# Patient Record
Sex: Male | Born: 1940
Health system: Southern US, Community
[De-identification: ages and names within clinical notes are randomized; demographics above are authoritative.]

## PROBLEM LIST (undated history)

## (undated) DIAGNOSIS — E785 Hyperlipidemia, unspecified: Secondary | ICD-10-CM

## (undated) DIAGNOSIS — K219 Gastro-esophageal reflux disease without esophagitis: Secondary | ICD-10-CM

## (undated) HISTORY — PX: HERNIA REPAIR: SHX51

## (undated) HISTORY — PX: COLON SURGERY: SHX602

## (undated) HISTORY — DX: Gastro-esophageal reflux disease without esophagitis: K21.9

## (undated) HISTORY — DX: Hyperlipidemia, unspecified: E78.5

---

## 1998-09-11 ENCOUNTER — Other Ambulatory Visit: Admission: RE | Admit: 1998-09-11 | Discharge: 1998-09-11 | Payer: Self-pay | Admitting: Gastroenterology

## 2002-06-05 ENCOUNTER — Encounter: Admission: RE | Admit: 2002-06-05 | Discharge: 2002-06-05 | Payer: Self-pay | Admitting: Family Medicine

## 2002-06-05 ENCOUNTER — Encounter: Payer: Self-pay | Admitting: Family Medicine

## 2003-06-27 ENCOUNTER — Ambulatory Visit (HOSPITAL_COMMUNITY): Admission: RE | Admit: 2003-06-27 | Discharge: 2003-06-27 | Payer: Self-pay | Admitting: Specialist

## 2008-06-26 ENCOUNTER — Encounter: Admission: RE | Admit: 2008-06-26 | Discharge: 2008-06-26 | Payer: Self-pay | Admitting: Gastroenterology

## 2008-07-26 ENCOUNTER — Encounter: Admission: RE | Admit: 2008-07-26 | Discharge: 2008-07-26 | Payer: Self-pay | Admitting: Surgery

## 2008-09-01 ENCOUNTER — Emergency Department (HOSPITAL_COMMUNITY): Admission: EM | Admit: 2008-09-01 | Discharge: 2008-09-01 | Payer: Self-pay | Admitting: Emergency Medicine

## 2008-09-18 ENCOUNTER — Encounter (INDEPENDENT_AMBULATORY_CARE_PROVIDER_SITE_OTHER): Payer: Self-pay | Admitting: *Deleted

## 2008-09-28 ENCOUNTER — Inpatient Hospital Stay (HOSPITAL_COMMUNITY): Admission: RE | Admit: 2008-09-28 | Discharge: 2008-09-29 | Payer: Self-pay | Admitting: Surgery

## 2008-12-19 ENCOUNTER — Inpatient Hospital Stay (HOSPITAL_COMMUNITY): Admission: RE | Admit: 2008-12-19 | Discharge: 2008-12-23 | Payer: Self-pay | Admitting: Surgery

## 2008-12-19 ENCOUNTER — Encounter (INDEPENDENT_AMBULATORY_CARE_PROVIDER_SITE_OTHER): Payer: Self-pay | Admitting: Surgery

## 2009-07-25 ENCOUNTER — Encounter: Admission: RE | Admit: 2009-07-25 | Discharge: 2009-07-25 | Payer: Self-pay | Admitting: Surgery

## 2009-08-26 ENCOUNTER — Telehealth: Payer: Self-pay | Admitting: Gastroenterology

## 2009-10-16 ENCOUNTER — Inpatient Hospital Stay (HOSPITAL_COMMUNITY): Admission: RE | Admit: 2009-10-16 | Discharge: 2009-10-19 | Payer: Self-pay | Admitting: Surgery

## 2010-04-21 ENCOUNTER — Ambulatory Visit (HOSPITAL_COMMUNITY): Admission: RE | Admit: 2010-04-21 | Discharge: 2010-04-21 | Payer: Self-pay | Admitting: Surgery

## 2010-08-12 NOTE — Progress Notes (Signed)
Summary: Changed GI  Phone Note Outgoing Call Call back at [facility] Phone [phone]   Call placed by: Harlow Mares CMA Duncan Dull),  August 26, 2009 11:58 AM Call placed to: Patient Summary of Call: patient changed to [facility] GI, needed Colonoscopy Initial call taken by: Harlow Mares CMA Duncan Dull),  August 26, 2009 11:58 AM

## 2010-10-01 LAB — DIFFERENTIAL
Basophils Absolute: 0 10*3/uL (ref 0.0–0.1)
Eosinophils Relative: 0 % (ref 0–5)
Lymphocytes Relative: 15 % (ref 12–46)
Lymphs Abs: 0.9 10*3/uL (ref 0.7–4.0)
Neutro Abs: 4.8 10*3/uL (ref 1.7–7.7)
Neutrophils Relative %: 75 % (ref 43–77)

## 2010-10-01 LAB — CBC
HCT: 36.7 % — ABNORMAL LOW (ref 39.0–52.0)
Platelets: 117 10*3/uL — ABNORMAL LOW (ref 150–400)
RDW: 15.4 % (ref 11.5–15.5)
WBC: 6.4 10*3/uL (ref 4.0–10.5)

## 2010-10-20 LAB — BASIC METABOLIC PANEL
BUN: 6 mg/dL (ref 6–23)
CO2: 30 mEq/L (ref 19–32)
Chloride: 107 mEq/L (ref 96–112)
Creatinine, Ser: 1 mg/dL (ref 0.4–1.5)
Creatinine, Ser: 1.09 mg/dL (ref 0.4–1.5)
GFR calc Af Amer: >60 mL/min (ref >60)
GFR calc Af Amer: >60 mL/min (ref >60)
GFR calc non Af Amer: >60 mL/min (ref >60)
Potassium: 4 mEq/L (ref 3.5–5.1)
Sodium: 142 mEq/L (ref 135–145)

## 2010-10-20 LAB — CBC
HCT: 39.9 % (ref 39.0–52.0)
Hemoglobin: 13.8 g/dL (ref 13.0–17.0)
MCV: 95.4 fL (ref 78.0–100.0)
Platelets: 144 10*3/uL — ABNORMAL LOW (ref 150–400)
RBC: 4.43 MIL/uL (ref 4.22–5.81)
WBC: 12.4 10*3/uL — ABNORMAL HIGH (ref 4.0–10.5)
WBC: 12.6 10*3/uL — ABNORMAL HIGH (ref 4.0–10.5)

## 2010-10-20 LAB — HEMOGLOBIN AND HEMATOCRIT, BLOOD
HCT: 44.2 % (ref 39.0–52.0)
Hemoglobin: 14.3 g/dL (ref 13.0–17.0)

## 2010-10-23 LAB — BASIC METABOLIC PANEL
BUN: 13 mg/dL (ref 6–23)
CO2: 27 mEq/L (ref 19–32)
Chloride: 106 mEq/L (ref 96–112)
Glucose, Bld: 104 mg/dL — ABNORMAL HIGH (ref 70–99)
Potassium: 3.8 mEq/L (ref 3.5–5.1)

## 2010-10-23 LAB — CBC
HCT: 37 % — ABNORMAL LOW (ref 39.0–52.0)
Hemoglobin: 12.1 g/dL — ABNORMAL LOW (ref 13.0–17.0)
Hemoglobin: 12.3 g/dL — ABNORMAL LOW (ref 13.0–17.0)
MCHC: 33.2 g/dL (ref 30.0–36.0)
MCHC: 33.5 g/dL (ref 30.0–36.0)
MCV: 97.7 fL (ref 78.0–100.0)
RBC: 3.78 MIL/uL — ABNORMAL LOW (ref 4.22–5.81)
RDW: 12.9 % (ref 11.5–15.5)

## 2010-10-23 LAB — DIFFERENTIAL
Basophils Relative: 0 % (ref 0–1)
Eosinophils Absolute: 0 10*3/uL (ref 0.0–0.7)
Eosinophils Relative: 0 % (ref 0–5)
Monocytes Absolute: 1.4 10*3/uL — ABNORMAL HIGH (ref 0.1–1.0)
Monocytes Relative: 13 % — ABNORMAL HIGH (ref 3–12)
Neutro Abs: 7.7 10*3/uL (ref 1.7–7.7)

## 2010-10-28 LAB — URINALYSIS, ROUTINE W REFLEX MICROSCOPIC
Protein, ur: 30 mg/dL — AB
Urobilinogen, UA: 1 mg/dL (ref 0.0–1.0)

## 2010-10-28 LAB — URINE MICROSCOPIC-ADD ON

## 2010-10-28 LAB — URINE CULTURE: Colony Count: >=100000

## 2010-11-25 NOTE — Op Note (Signed)
NAME:  Michael Brock, Michael Brock NO.:  [id number]   MEDICAL RECORD NO.:  [id number]          PATIENT TYPE:  OIB   LOCATION:  1526                         FACILITY:  [facility]   PHYSICIAN:  Thornton Park. Daphine Deutscher, MD  DATE OF BIRTH:  12-Feb-1941   DATE OF PROCEDURE:  09/26/2008  DATE OF DISCHARGE:                               OPERATIVE REPORT   PREOPERATIVE DIAGNOSIS:  Large complex type 3 mixed hiatal hernia with  some organoaxial volvulus.   POSTOPERATIVE DIAGNOSIS:  Large complex type 3 mixed hiatal hernia with  some organoaxial volvulus.   PROCEDURE:  Laparoscopic takedown of hiatal hernia with removal of sac,  closure of diaphragm with 4 sutures posteriorly and 1 anteriorly - all  with pledgets, and gastropexy x2 and upper endoscopy.   SURGEON:  Luretha Murphy, M.D.   ASSISTANT:  Baruch Merl, M.D.   ANESTHESIA:  General endotracheal.   DESCRIPTION OF PROCEDURE:  Michael Brock is a 70 year old man with a large  type 3 mixed hiatal hernia.  Also has an adenomatous polyp of his colon,  and we would need to address it at a separate time.  Today, he was taken  to room 1 and given general anesthesia.  The abdomen was prepped with a  Techni-Care equivalent and draped sterilely.  Access to the abdomen was  gained through the left upper quadrant using 0-degree OptiView.  Once  the abdomen was entered, it was insufflated.  Standard trocars included  a 5 in the upper midline for the [facility] and the rest were 12's, two  on the right, one slightly to the left the umbilicus and a second  laterally on the left.  The stomach was pretty much all into the chest.  I went ahead and began reducing that, and at some point, I just began  taking down the sac along the right crus and then teasing it away, going  up into the mediastinum and then doing the same anteriorly, carrying it  anteriorly and over on the left side.  I then identified the left crus,  and I cut the sac away from the left  crus and completed the sac  mobilization there.  I then went down below and went underneath and put  a Penrose drain around the stomach at that point.   With that in place, with traction, I then dissected the foregut taking  away the adhesions of the stomach and sac from the mediastinum.  Dr.  Colin Benton passed the endoscope, and we checked our anatomy with that to make  certain we were where we thought we were and also to look for any  evidence of any occult injury.  We did not feel any was there, and we  did not see any evidence of that with endoscopy.   I felt like to try to do a wrap was not a prudent thing at this point  because of the distortion at the GE junction and the amount of bulky  tissue that had come to be around that, and I did not want to risk  perforation and elected to go  ahead and close the hiatus which I did  with 4 sutures posteriorly with pledgets, alternating tie knots and free  ties from the outside and one anteriorly.  We passed a 50 lighted  bougie, and this completely occupied the closed area.  I then took the  distal fundus and tacked it through the anterior abdominal wall.  We had  to get below the ribs and did so in 2 places.  The superior most when I  actually tied a knot in the stomach and then brought the 2 limbs out and  tied that down.  The second was through into the stomach, and we tied it  straight up to the anterior abdominal wall.  Once the pexy was  completed, the abdomen was deflated.  Liver was placed back in its  anatomic position with the Richton out.  There was no evidence of any  other problems.  I looked at the colon going in and did not see  anything obviously, and the liver looked clean, so we went ahead and  deflated.  I injected port sites with half percent Marcaine, and I  closed the individual trocar sites with 4-0 Vicryl, Benzoin and Steri-  Strips.  The patient seemed to tolerate the procedure well and was taken  to the recovery room  in satisfactory addition.      Thornton Park Daphine Deutscher, MD  Electronically Signed     MBM/MEDQ  D:  09/26/2008  T:  09/26/2008  Job:  [id number]   cc:   Donia Guiles, M.D.  Fax: [phone]   Tasia Catchings, M.D.  Fax: [phone]

## 2010-11-25 NOTE — Op Note (Signed)
NAME:  Michael Brock, Michael Brock NO.:  [id number]   MEDICAL RECORD NO.:  [id number]          PATIENT TYPE:  INP   LOCATION:  1529                         FACILITY:  [facility]   PHYSICIAN:  Thornton Park. Daphine Deutscher, MD  DATE OF BIRTH:  Aug 07, 1940   DATE OF PROCEDURE:  12/19/2008  DATE OF DISCHARGE:                               OPERATIVE REPORT   PREOPERATIVE DIAGNOSIS:  Adenomatous polyp in the proximal transverse  colon unresectable with colonoscopy, tattooed on June 8.   PROCEDURE:  Laparoscopically assisted right hemicolectomy and repair of  umbilical hernia.   SURGEON:  Valarie Merino, MD   ASSISTANT:  Iona Coach, MD   SPECIMEN:  Right colon with tattooed area in the proximal transverse  colon.   DESCRIPTION OF PROCEDURE:  This 70 year old man was taken to the OR 1 on  Wednesday, December 19, 2008, and given general anesthesia.  The abdomen was  prepped with Techni-Care equivalent and draped sterilely.  Entered the  abdomen through an old trocar site in the left upper quadrant using an  Optiview technique and 5 mm trocar and 0 degree scope.  The abdomen was  entered and insufflated and then two other 5 were used one toward the  midline right at the umbilicus where I would subsequently repair his  umbilical hernia and another one in the left lower quadrant.  Through  these I was able to then used a Harmonic scalpel and I mobilized his  cecum, ascending colon and the proximal transverse colon.  When  mobilized I then was able to then go up and make a small transverse  incision incorporating the hernia and then peeling away his  properitoneal hernia and hernia sacs and removing those converting this  to one incision.  I then put in the wound protector by Excel.  I then  brought the colon out, marked it and found the area with the tattoo  which was readily visible.  I divided it in the terminal ileum and  distal to the tattooed area.  I performed a functional end-to-end  using  the stapler, aligning the bowel side-to-side.  First I closed the  mesenteric defect with interrupted silks and then performed the  anastomosis using the GIA along the tinea and along the antimesenteric  border of the small bowel.  This common defect was closed in two layers  with running 4-0 PDS and with 3-0 silk Lemberts.  We changed our gloves.  We also looked at the specimen to ensure that we had gotten it.  The  abdomen was irrigated.  No bleeding was seen.  I had taken down some of  the omentum off of that portion of the colon and that was not bleeding  and that was allowed to sort of return to the area of the colectomy.  The wound was then closed with zero Prolene popoffs medial to the  rectus.  The posterior rectus sheath was closed  with a running #1 PDS.  The anterior sheath was closed with interrupted  zero Prolene as well.  The wound was irrigated and the  skin was closed  with 4-0 Vicryl subcutaneously and with staples as were the puncture  holes for the trocars.  The patient tolerated the procedure well and was  taken to the recovery room in satisfactory condition.      Thornton Park Daphine Deutscher, MD  Electronically Signed     MBM/MEDQ  D:  12/19/2008  T:  12/19/2008  Job:  [id number]   cc:   Donia Guiles, M.D.  Fax: [phone]   Tasia Catchings, M.D.  Fax: [phone]

## 2010-11-28 NOTE — Discharge Summary (Signed)
NAME:  TAZ, VANNESS NO.:  [id number]   MEDICAL RECORD NO.:  [id number]          PATIENT TYPE:  INP   LOCATION:  1526                         FACILITY:  [facility]   PHYSICIAN:  Thornton Park. Daphine Deutscher, MD  DATE OF BIRTH:  1941-06-30   DATE OF ADMISSION:  09/26/2008  DATE OF DISCHARGE:  09/29/2008                               DISCHARGE SUMMARY   ADMITTING DIAGNOSES:  1. Giant type 3 mixed hiatal hernia taken care of during this      admission with laparoscopy takedown hiatal hernia, closure      diaphragm, gastropexy x2.  2. Adenomatous polyp of the transverse colon to be taken down within      the next 3 months with a laparoscopically-assisted colectomy.   COURSE IN THE HOSPITAL:  Mr. Baby is a 59 year old man who came in the  hospital for the above-mentioned operation.  He had his hiatal hernia  repaired with pledgeted closure posteriorly and a gastropexy.  Water  contrast study on March 18 showed no evidence of a leak.  Labs were  checked and looked fine.  He did well and was ready for discharge on  postop day #3.  Condition good.  Return to his preop medications.      Thornton Park Daphine Deutscher, MD  Electronically Signed     MBM/MEDQ  D:  10/22/2008  T:  10/22/2008  Job:  [id number]

## 2010-11-28 NOTE — Discharge Summary (Signed)
NAME:  Michael Brock, Michael Brock NO.:  [id number]   MEDICAL RECORD NO.:  [id number]          PATIENT TYPE:  INP   LOCATION:  1529                         FACILITY:  [facility]   PHYSICIAN:  Thornton Park. Daphine Deutscher, MD  DATE OF BIRTH:  [birth year]   DATE OF ADMISSION:  12/19/2008  DATE OF DISCHARGE:  12/23/2008                               DISCHARGE SUMMARY   CHIEF COMPLAINT:  Adenomatous polyps of the transverse colon, status  post tattooing procedure December 19, 2008, lap assisted right hemicolectomy.  Path diagnosis:  Tubulovillous adenoma.  No evidence of cancer.   DESCRIPTION OF PROCEDURE:  Michael Brock a 70 year old white male who was  admitted and underwent a right hemicolectomy lap, assisted.  He did very  well postoperatively.  I was able to look at the place where I had  previously done his hiatal hernia about six weeks before and it looked  good too.  He did well postoperatively.  He was discharged home to  follow-up in the office in 1-2 weeks.      Thornton Park Daphine Deutscher, MD  Electronically Signed     MBM/MEDQ  D:  01/23/2009  T:  01/24/2009  Job:  [id number]

## 2011-06-07 IMAGING — RF DG UGI W/ HIGH DENSITY W/KUB
16 of 23 series · 16 of 23 positions shown · non-contrast
Comparison: Preoperative study 07/26/2008.

CLINICAL DATA: 68-year-old male status post gastropexy surgery for
gastric volvulus/intrathoracic stomach in 8373.  Recent onset of
intermittent symptoms of esophageal obstruction, difficulty
swallowing.

UPPER GI SERIES W/HIGH DENSITY W/KUB
TECHNIQUE: After obtaining a scout radiograph, upper GI series
performed with barium and effervescent agent.
Fluoroscopy Time: 1.6 minutes

[Series 1: run · 1 of 1 slices shown (1 of 14)]
[im 1/1]
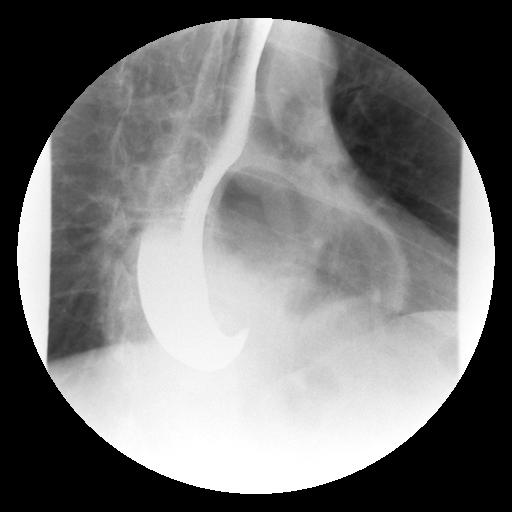

[Series 3: run · 1 of 1 slices shown (2 of 14)]
[im 1/1]
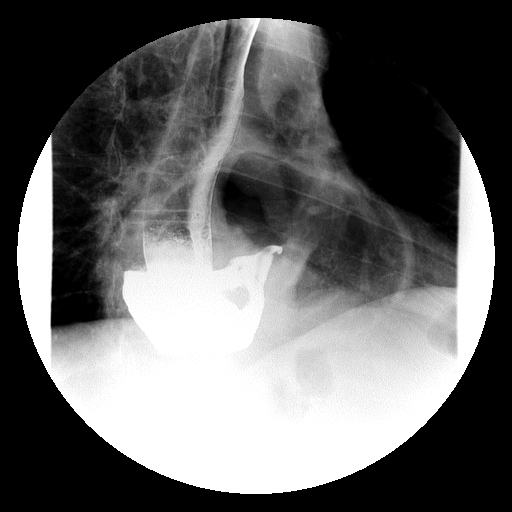

[Series 4: run · 1 of 1 slices shown (3 of 14)]
[im 1/1]
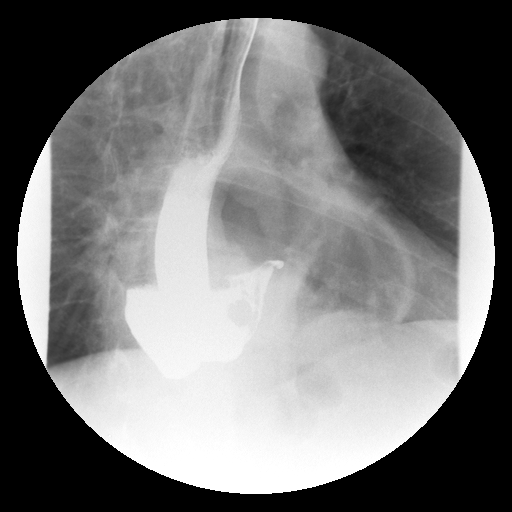

[Series 6: run · 1 of 1 slices shown (4 of 14)]
[im 1/1]
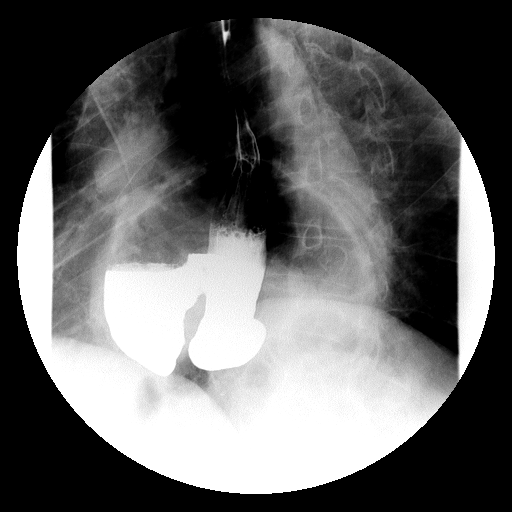

[Series 7: run · 1 of 1 slices shown (5 of 14)]
[im 1/1]
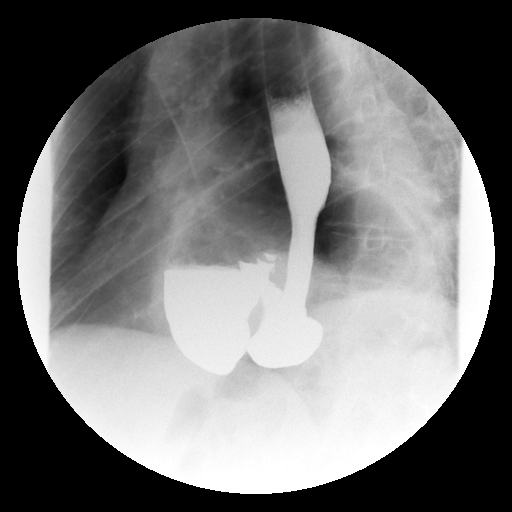

[Series 8: run · 1 of 1 slices shown (6 of 14)]
[im 1/1]
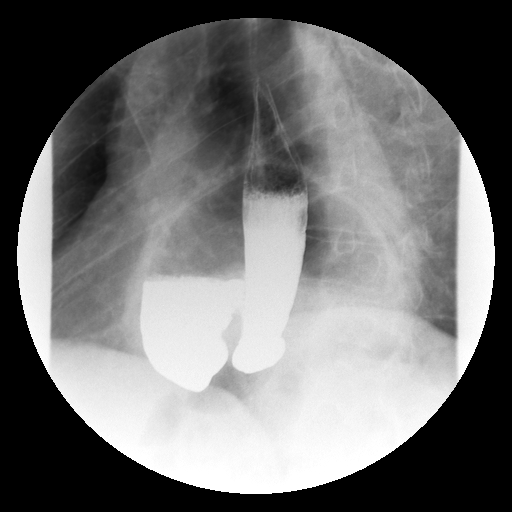

[Series 10: run · 1 of 1 slices shown (7 of 14)]
[im 1/1]
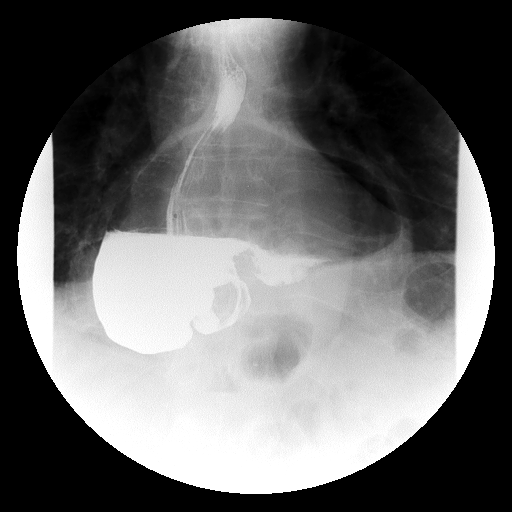

[Series 11: run · 1 of 1 slices shown (8 of 14)]
[im 1/1]
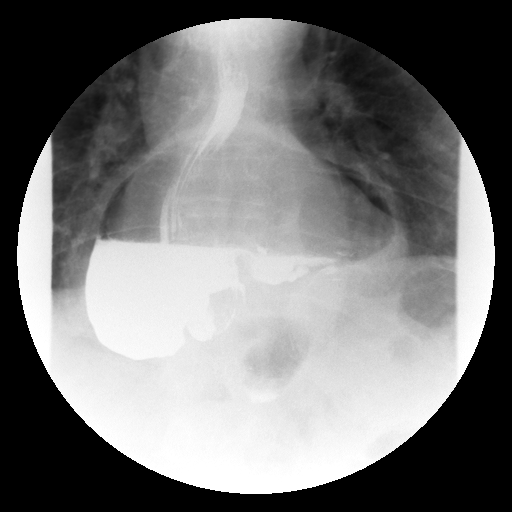

[Series 13: run · 1 of 1 slices shown (9 of 14)]
[im 1/1]
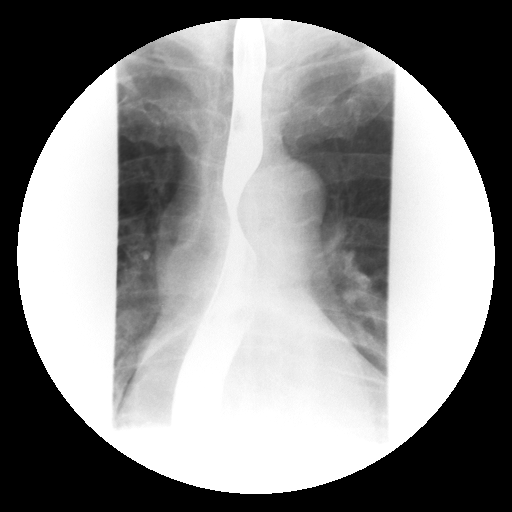

[Series 14: run · 1 of 1 slices shown (10 of 14)]
[im 1/1]
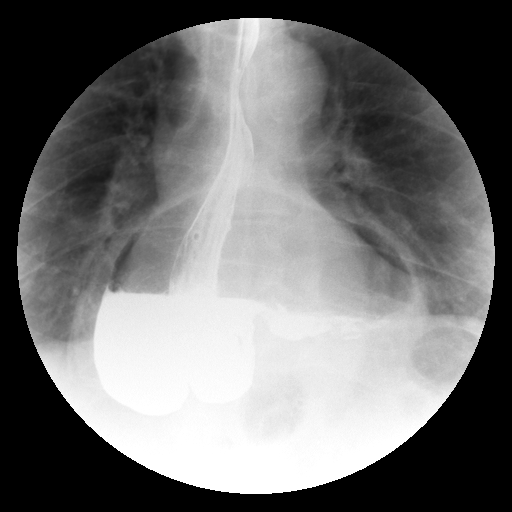

[Series 16: run · 1 of 1 slices shown (11 of 14)]
[im 1/1]
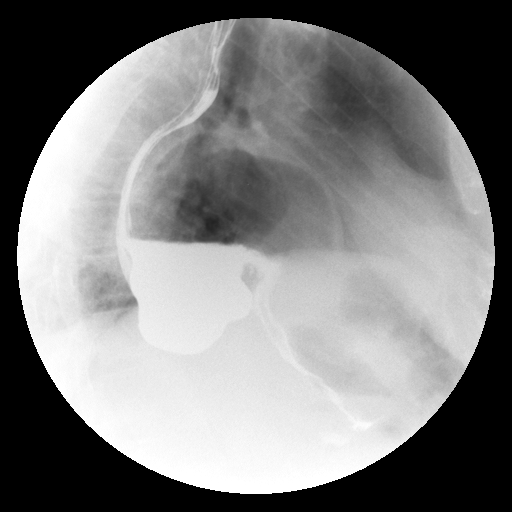

[Series 17: run · 1 of 1 slices shown (12 of 14)]
[im 1/1]
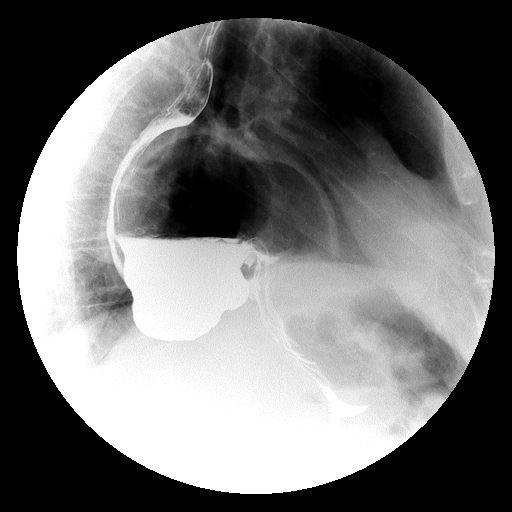

[Series 18: run · 1 of 1 slices shown (13 of 14)]
[im 1/1]
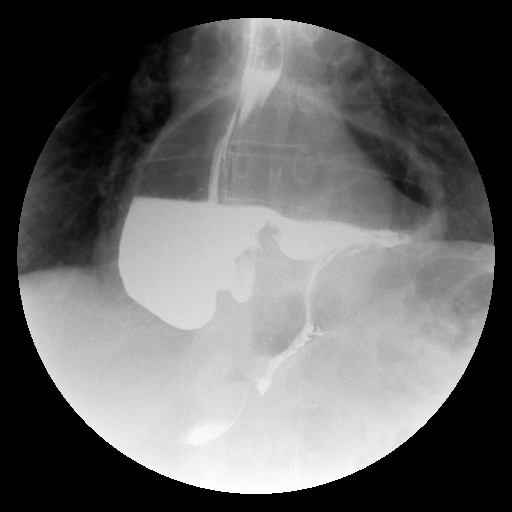

[Series 20: run · 1 of 1 slices shown (14 of 14)]
[im 1/1]
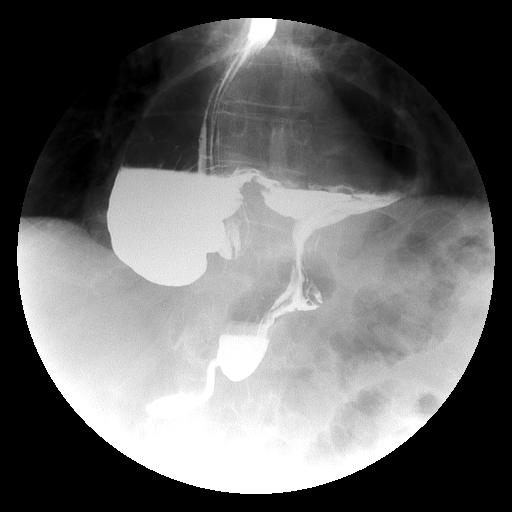

[Series 1001: view not recorded · 0.20mm/px · 1 of 1 slices shown (1 of 2)]
[im 1/1]
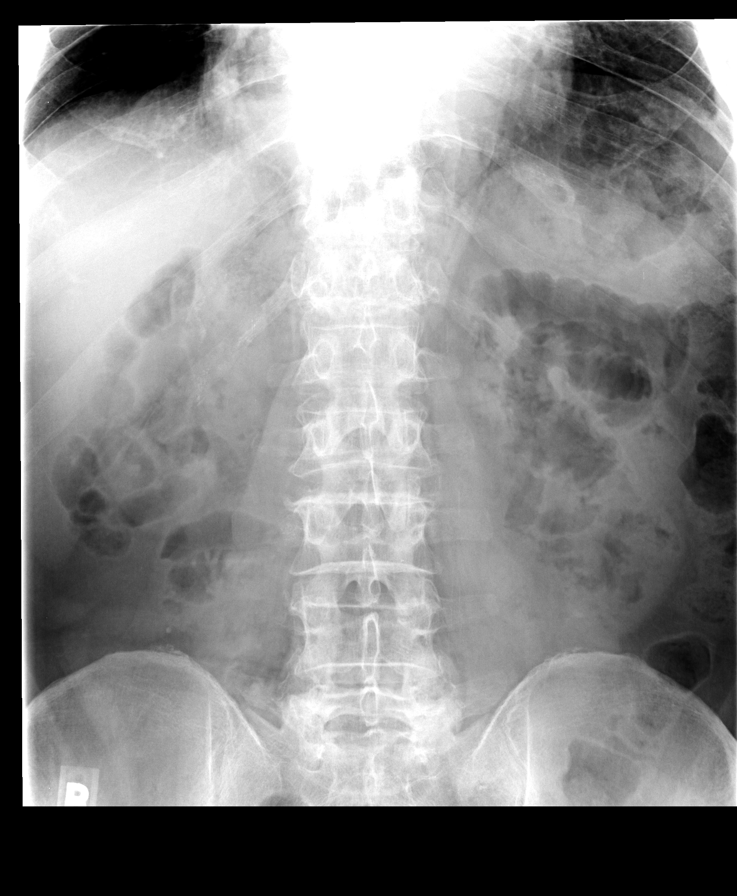

[Series 1003: view not recorded · 0.20mm/px · 1 of 1 slices shown (2 of 2)]
[im 1/1]
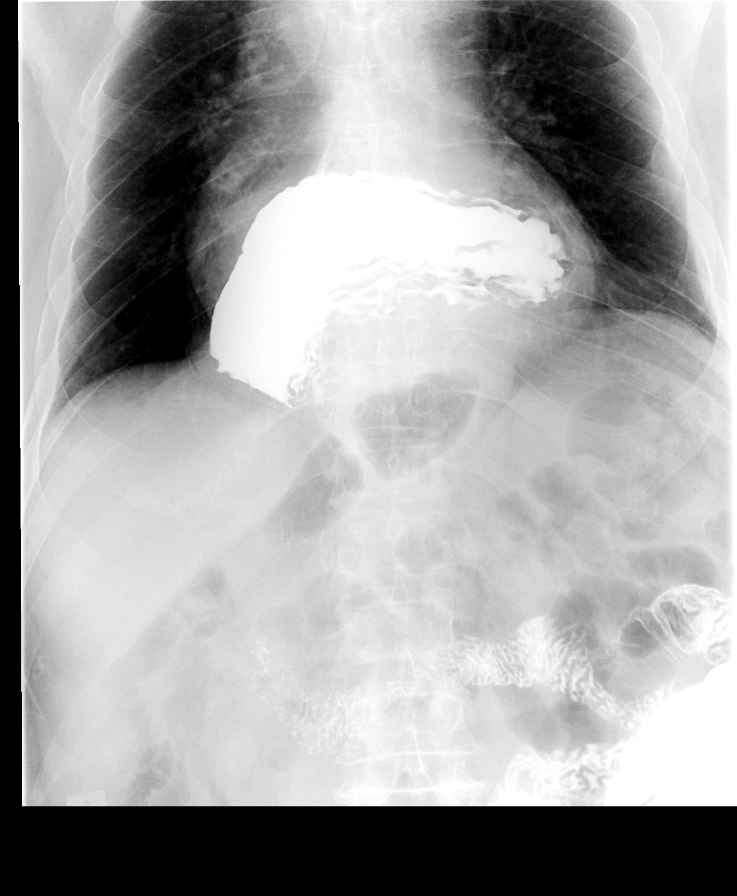

[16 of 23 positions shown; findings below may reference images not displayed]

FINDINGS: Preprocedural scout view of the abdomen demonstrates a
nonobstructed bowel gas pattern.  Surgical clips are seen at the
level of the diaphragm in the midline.  There is increased
retrocardiac density.

The patient tolerated the study well without difficulty.  The
thoracic esophagus is mildly distended, and air contrast level is
maintained in the distal esophagus.

There is fairly prompt transit of contrast through the
gastroesophageal junction which is located above the diaphragm, and
just to the right of midline.  The gastroesophageal junction, and
the body of the stomach are inverted.  The gastric cardia is
located on the right and posteriorly while the distal body on the
left and anteriorly.  The stomach is located anterior to the distal
thoracic esophagus, and there is mass effect due to the gastric
distention.  More proximal thoracic esophagus is normal.  The
distal gastric body and antrum then coursed through the diaphragm
anterior and to the left of the proximal stomach (image 17). After
short delay, gastric emptying is noted.  Duodenal sweep and
visualized proximal small bowel loops are normal.
IMPRESSION: 1.  Recurrence of gastric hernia/intrathoracic stomach with a
somewhat different configuration from the previous study, detailed
above (see radiograph #[DATE].  Mass effect on the distal thoracic esophagus.  No gastric
obstruction.

## 2011-07-30 DIAGNOSIS — L821 Other seborrheic keratosis: Secondary | ICD-10-CM | POA: Diagnosis not present

## 2011-07-30 DIAGNOSIS — Z85828 Personal history of other malignant neoplasm of skin: Secondary | ICD-10-CM | POA: Diagnosis not present

## 2011-09-04 DIAGNOSIS — J069 Acute upper respiratory infection, unspecified: Secondary | ICD-10-CM | POA: Diagnosis not present

## 2011-09-14 DIAGNOSIS — E782 Mixed hyperlipidemia: Secondary | ICD-10-CM | POA: Diagnosis not present

## 2011-09-24 DIAGNOSIS — H2510 Age-related nuclear cataract, unspecified eye: Secondary | ICD-10-CM | POA: Diagnosis not present

## 2011-11-19 DIAGNOSIS — K219 Gastro-esophageal reflux disease without esophagitis: Secondary | ICD-10-CM | POA: Diagnosis not present

## 2011-11-19 DIAGNOSIS — K2270 Barrett's esophagus without dysplasia: Secondary | ICD-10-CM | POA: Diagnosis not present

## 2011-11-19 DIAGNOSIS — Z86010 Personal history of colonic polyps: Secondary | ICD-10-CM | POA: Diagnosis not present

## 2012-01-04 ENCOUNTER — Other Ambulatory Visit: Payer: Self-pay | Admitting: Gastroenterology

## 2012-01-04 DIAGNOSIS — Z09 Encounter for follow-up examination after completed treatment for conditions other than malignant neoplasm: Secondary | ICD-10-CM | POA: Diagnosis not present

## 2012-01-04 DIAGNOSIS — K5730 Diverticulosis of large intestine without perforation or abscess without bleeding: Secondary | ICD-10-CM | POA: Diagnosis not present

## 2012-01-04 DIAGNOSIS — Z86010 Personal history of colonic polyps: Secondary | ICD-10-CM | POA: Diagnosis not present

## 2012-01-04 DIAGNOSIS — D126 Benign neoplasm of colon, unspecified: Secondary | ICD-10-CM | POA: Diagnosis not present

## 2012-04-27 DIAGNOSIS — Z23 Encounter for immunization: Secondary | ICD-10-CM | POA: Diagnosis not present

## 2012-05-05 DIAGNOSIS — E782 Mixed hyperlipidemia: Secondary | ICD-10-CM | POA: Diagnosis not present

## 2012-05-05 DIAGNOSIS — K219 Gastro-esophageal reflux disease without esophagitis: Secondary | ICD-10-CM | POA: Diagnosis not present

## 2012-05-05 DIAGNOSIS — K2270 Barrett's esophagus without dysplasia: Secondary | ICD-10-CM | POA: Diagnosis not present

## 2012-05-05 DIAGNOSIS — E669 Obesity, unspecified: Secondary | ICD-10-CM | POA: Diagnosis not present

## 2012-05-19 DIAGNOSIS — Z86010 Personal history of colonic polyps: Secondary | ICD-10-CM | POA: Diagnosis not present

## 2012-05-19 DIAGNOSIS — K2270 Barrett's esophagus without dysplasia: Secondary | ICD-10-CM | POA: Diagnosis not present

## 2012-05-19 DIAGNOSIS — K219 Gastro-esophageal reflux disease without esophagitis: Secondary | ICD-10-CM | POA: Diagnosis not present

## 2012-05-19 DIAGNOSIS — R141 Gas pain: Secondary | ICD-10-CM | POA: Diagnosis not present

## 2012-05-24 DIAGNOSIS — R12 Heartburn: Secondary | ICD-10-CM | POA: Diagnosis not present

## 2012-05-24 DIAGNOSIS — K2970 Gastritis, unspecified, without bleeding: Secondary | ICD-10-CM | POA: Diagnosis not present

## 2012-05-24 DIAGNOSIS — K2990 Gastroduodenitis, unspecified, without bleeding: Secondary | ICD-10-CM | POA: Diagnosis not present

## 2012-05-24 DIAGNOSIS — K2980 Duodenitis without bleeding: Secondary | ICD-10-CM | POA: Diagnosis not present

## 2012-05-24 DIAGNOSIS — K449 Diaphragmatic hernia without obstruction or gangrene: Secondary | ICD-10-CM | POA: Diagnosis not present

## 2012-05-24 DIAGNOSIS — K2270 Barrett's esophagus without dysplasia: Secondary | ICD-10-CM | POA: Diagnosis not present

## 2012-07-01 DIAGNOSIS — K219 Gastro-esophageal reflux disease without esophagitis: Secondary | ICD-10-CM | POA: Diagnosis not present

## 2012-07-07 ENCOUNTER — Encounter (INDEPENDENT_AMBULATORY_CARE_PROVIDER_SITE_OTHER): Payer: Self-pay | Admitting: Surgery

## 2012-07-28 DIAGNOSIS — L219 Seborrheic dermatitis, unspecified: Secondary | ICD-10-CM | POA: Diagnosis not present

## 2012-07-28 DIAGNOSIS — L570 Actinic keratosis: Secondary | ICD-10-CM | POA: Diagnosis not present

## 2012-07-28 DIAGNOSIS — D239 Other benign neoplasm of skin, unspecified: Secondary | ICD-10-CM | POA: Diagnosis not present

## 2012-07-28 DIAGNOSIS — Z85828 Personal history of other malignant neoplasm of skin: Secondary | ICD-10-CM | POA: Diagnosis not present

## 2012-07-28 DIAGNOSIS — B351 Tinea unguium: Secondary | ICD-10-CM | POA: Diagnosis not present

## 2012-07-28 DIAGNOSIS — D1801 Hemangioma of skin and subcutaneous tissue: Secondary | ICD-10-CM | POA: Diagnosis not present

## 2012-07-28 DIAGNOSIS — L909 Atrophic disorder of skin, unspecified: Secondary | ICD-10-CM | POA: Diagnosis not present

## 2012-07-28 DIAGNOSIS — L821 Other seborrheic keratosis: Secondary | ICD-10-CM | POA: Diagnosis not present

## 2012-10-24 DIAGNOSIS — H2510 Age-related nuclear cataract, unspecified eye: Secondary | ICD-10-CM | POA: Diagnosis not present

## 2012-11-01 DIAGNOSIS — J069 Acute upper respiratory infection, unspecified: Secondary | ICD-10-CM | POA: Diagnosis not present

## 2012-11-03 DIAGNOSIS — E782 Mixed hyperlipidemia: Secondary | ICD-10-CM | POA: Diagnosis not present

## 2012-12-29 DIAGNOSIS — Z86010 Personal history of colonic polyps: Secondary | ICD-10-CM | POA: Diagnosis not present

## 2012-12-29 DIAGNOSIS — K219 Gastro-esophageal reflux disease without esophagitis: Secondary | ICD-10-CM | POA: Diagnosis not present

## 2013-01-11 DIAGNOSIS — M949 Disorder of cartilage, unspecified: Secondary | ICD-10-CM | POA: Diagnosis not present

## 2013-01-11 DIAGNOSIS — Z79899 Other long term (current) drug therapy: Secondary | ICD-10-CM | POA: Diagnosis not present

## 2013-01-11 DIAGNOSIS — Z13820 Encounter for screening for osteoporosis: Secondary | ICD-10-CM | POA: Diagnosis not present

## 2013-01-11 DIAGNOSIS — M899 Disorder of bone, unspecified: Secondary | ICD-10-CM | POA: Diagnosis not present

## 2013-04-11 ENCOUNTER — Ambulatory Visit (INDEPENDENT_AMBULATORY_CARE_PROVIDER_SITE_OTHER): Payer: Medicare Other | Admitting: Surgery

## 2013-04-11 ENCOUNTER — Encounter (INDEPENDENT_AMBULATORY_CARE_PROVIDER_SITE_OTHER): Payer: Self-pay | Admitting: Surgery

## 2013-04-11 VITALS — BP 118/77 | HR 70 | Temp 97.8°F | Resp 12 | Ht 71.0 in | Wt 218.2 lb

## 2013-04-11 DIAGNOSIS — K645 Perianal venous thrombosis: Secondary | ICD-10-CM | POA: Diagnosis not present

## 2013-04-11 MED ORDER — HYDROCODONE-ACETAMINOPHEN 5-325 MG PO TABS: 1.0000 | ORAL_TABLET | Freq: Four times a day (QID) | ORAL | Status: DC | PRN

## 2013-04-11 NOTE — Progress Notes (Signed)
Subjective:     Patient ID: Michael Brock, male   DOB: 07/05/1941, 72 y.o.   MRN: [id number]  HPI Patient sent at request of Dr. Cam Hai for perianal pain. This started 5 days ago. The patient is an his anus. The swelling as well. The pain is moderate to severe. No drainage. No history of constipation or straining on the commode.  Review of Systems  Constitutional: Negative for fever and fatigue.  HENT: Negative.   Eyes: Negative.   Respiratory: Negative.   Cardiovascular: Negative.   Gastrointestinal: Positive for rectal pain.  Musculoskeletal: Negative.   Hematological: Negative.   Psychiatric/Behavioral: Negative.        Objective:   Physical Exam  Constitutional: He is oriented to person, place, and time. He appears well-developed and well-nourished.  HENT:  Head: Normocephalic and atraumatic.  Genitourinary: Rectal exam shows external hemorrhoid. Rectal exam shows no fissure.     Musculoskeletal: Normal range of motion.  Neurological: He is alert and oriented to person, place, and time.  Skin: Skin is warm and dry.  Psychiatric: He has a normal mood and affect. His behavior is normal. Judgment and thought content normal.       Assessment:     Thrombosed external hemorrhoid left lateral position    Plan:     Discussed treatment options of excision versus observation. Risks, benefits of each discussed. Risk of bleeding, infection, recurrence,. He would like to proceed with excision. Under sterile conditions the anal canal was prepped. 1% lidocaine with epinephrine was used to inject around the hemorrhoid complex. Scalpel was used to excise the thrombosed hemorrhoid in its entirety. Hemostasis achieved. Dry dressing applied. Procedure tolerated well. Post procedure instructions given. Norco 5/325 #30 given for pain. Return as needed. Sits baths 2-3 times a day the next 3-4 days. Restrain from heavy lifting or lifting for one week. Return as needed.

## 2013-04-11 NOTE — Patient Instructions (Addendum)
Hemorrhoids Hemorrhoids are swollen veins around the rectum or anus. There are two types of hemorrhoids:   Internal hemorrhoids. These occur in the veins just inside the rectum. They may poke through to the outside and become irritated and painful.  External hemorrhoids. These occur in the veins outside the anus and can be felt as a painful swelling or hard lump near the anus. CAUSES  Pregnancy.   Obesity.   Constipation or diarrhea.   Straining to have a bowel movement.   Sitting for long periods on the toilet.  Heavy lifting or other activity that caused you to strain.  Anal intercourse. SYMPTOMS   Pain.   Anal itching or irritation.   Rectal bleeding.   Fecal leakage.   Anal swelling.   One or more lumps around the anus.  DIAGNOSIS  Your caregiver may be able to diagnose hemorrhoids by visual examination. Other examinations or tests that may be performed include:   Examination of the rectal area with a gloved hand (digital rectal exam).   Examination of anal canal using a small tube (scope).   A blood test if you have lost a significant amount of blood.  A test to look inside the colon (sigmoidoscopy or colonoscopy). TREATMENT Most hemorrhoids can be treated at home. However, if symptoms do not seem to be getting better or if you have a lot of rectal bleeding, your caregiver may perform a procedure to help make the hemorrhoids get smaller or remove them completely. Possible treatments include:   Placing a rubber band at the base of the hemorrhoid to cut off the circulation (rubber band ligation).   Injecting a chemical to shrink the hemorrhoid (sclerotherapy).   Using a tool to burn the hemorrhoid (infrared light therapy).   Surgically removing the hemorrhoid (hemorrhoidectomy).   Stapling the hemorrhoid to block blood flow to the tissue (hemorrhoid stapling).  HOME CARE INSTRUCTIONS   Eat foods with fiber, such as whole grains, beans,  nuts, fruits, and vegetables. Ask your doctor about taking products with added fiber in them (fibersupplements).  Increase fluid intake. Drink enough water and fluids to keep your urine clear or pale yellow.   Exercise regularly.   Go to the bathroom when you have the urge to have a bowel movement. Do not wait.   Avoid straining to have bowel movements.   Keep the anal area dry and clean. Use wet toilet paper or moist towelettes after a bowel movement.   Medicated creams and suppositories may be used or applied as directed.   Only take over-the-counter or prescription medicines as directed by your caregiver.   Take warm sitz baths for 15 20 minutes, 3 4 times a day to ease pain and discomfort.   Place ice packs on the hemorrhoids if they are tender and swollen. Using ice packs between sitz baths may be helpful.   Put ice in a plastic bag.   Place a towel between your skin and the bag.   Leave the ice on for 15 20 minutes, 3 4 times a day.   Do not use a donut-shaped pillow or sit on the toilet for long periods. This increases blood pooling and pain.  SEEK MEDICAL CARE IF:  You have increasing pain and swelling that is not controlled by treatment or medicine.  You have uncontrolled bleeding.  You have difficulty or you are unable to have a bowel movement.  You have pain or inflammation outside the area of the hemorrhoids. MAKE SURE YOU:    Understand these instructions.  Will watch your condition.  Will get help right away if you are not doing well or get worse. Document Released: 06/26/2000 Document Revised: 06/15/2012 Document Reviewed: 05/03/2012 Exit[employer] Patient Information 2014 [employer].  

## 2013-04-19 ENCOUNTER — Encounter (INDEPENDENT_AMBULATORY_CARE_PROVIDER_SITE_OTHER): Payer: Self-pay

## 2013-05-29 DIAGNOSIS — Z0000 Encounter for general adult medical examination without abnormal findings: Secondary | ICD-10-CM | POA: Diagnosis not present

## 2013-05-29 DIAGNOSIS — Z125 Encounter for screening for malignant neoplasm of prostate: Secondary | ICD-10-CM | POA: Diagnosis not present

## 2013-05-29 DIAGNOSIS — M899 Disorder of bone, unspecified: Secondary | ICD-10-CM | POA: Diagnosis not present

## 2013-05-29 DIAGNOSIS — K2270 Barrett's esophagus without dysplasia: Secondary | ICD-10-CM | POA: Diagnosis not present

## 2013-05-29 DIAGNOSIS — Z1331 Encounter for screening for depression: Secondary | ICD-10-CM | POA: Diagnosis not present

## 2013-05-29 DIAGNOSIS — Z23 Encounter for immunization: Secondary | ICD-10-CM | POA: Diagnosis not present

## 2013-05-29 DIAGNOSIS — R7301 Impaired fasting glucose: Secondary | ICD-10-CM | POA: Diagnosis not present

## 2013-05-29 DIAGNOSIS — E669 Obesity, unspecified: Secondary | ICD-10-CM | POA: Diagnosis not present

## 2013-05-29 DIAGNOSIS — E782 Mixed hyperlipidemia: Secondary | ICD-10-CM | POA: Diagnosis not present

## 2013-06-20 DIAGNOSIS — R7989 Other specified abnormal findings of blood chemistry: Secondary | ICD-10-CM | POA: Diagnosis not present

## 2013-08-03 ENCOUNTER — Other Ambulatory Visit: Payer: Self-pay | Admitting: Dermatology

## 2013-08-03 DIAGNOSIS — L570 Actinic keratosis: Secondary | ICD-10-CM | POA: Diagnosis not present

## 2013-08-03 DIAGNOSIS — D1801 Hemangioma of skin and subcutaneous tissue: Secondary | ICD-10-CM | POA: Diagnosis not present

## 2013-08-03 DIAGNOSIS — L819 Disorder of pigmentation, unspecified: Secondary | ICD-10-CM | POA: Diagnosis not present

## 2013-08-03 DIAGNOSIS — D485 Neoplasm of uncertain behavior of skin: Secondary | ICD-10-CM | POA: Diagnosis not present

## 2013-08-03 DIAGNOSIS — B351 Tinea unguium: Secondary | ICD-10-CM | POA: Diagnosis not present

## 2013-08-03 DIAGNOSIS — L821 Other seborrheic keratosis: Secondary | ICD-10-CM | POA: Diagnosis not present

## 2013-08-03 DIAGNOSIS — Z85828 Personal history of other malignant neoplasm of skin: Secondary | ICD-10-CM | POA: Diagnosis not present

## 2013-08-03 DIAGNOSIS — C44319 Basal cell carcinoma of skin of other parts of face: Secondary | ICD-10-CM | POA: Diagnosis not present

## 2013-08-28 DIAGNOSIS — Z85828 Personal history of other malignant neoplasm of skin: Secondary | ICD-10-CM | POA: Diagnosis not present

## 2013-08-28 DIAGNOSIS — C44319 Basal cell carcinoma of skin of other parts of face: Secondary | ICD-10-CM | POA: Diagnosis not present

## 2013-11-02 DIAGNOSIS — H2510 Age-related nuclear cataract, unspecified eye: Secondary | ICD-10-CM | POA: Diagnosis not present

## 2013-11-08 DIAGNOSIS — H2510 Age-related nuclear cataract, unspecified eye: Secondary | ICD-10-CM | POA: Diagnosis not present

## 2013-11-22 DIAGNOSIS — H269 Unspecified cataract: Secondary | ICD-10-CM | POA: Diagnosis not present

## 2013-11-22 DIAGNOSIS — H2510 Age-related nuclear cataract, unspecified eye: Secondary | ICD-10-CM | POA: Diagnosis not present

## 2013-11-22 DIAGNOSIS — H2589 Other age-related cataract: Secondary | ICD-10-CM | POA: Diagnosis not present

## 2014-01-04 DIAGNOSIS — K219 Gastro-esophageal reflux disease without esophagitis: Secondary | ICD-10-CM | POA: Diagnosis not present

## 2014-01-04 DIAGNOSIS — Z86010 Personal history of colonic polyps: Secondary | ICD-10-CM | POA: Diagnosis not present

## 2014-01-04 DIAGNOSIS — Z79899 Other long term (current) drug therapy: Secondary | ICD-10-CM | POA: Diagnosis not present

## 2014-04-09 DIAGNOSIS — Z23 Encounter for immunization: Secondary | ICD-10-CM | POA: Diagnosis not present

## 2014-05-29 ENCOUNTER — Other Ambulatory Visit: Payer: Self-pay | Admitting: Dermatology

## 2014-05-29 DIAGNOSIS — D485 Neoplasm of uncertain behavior of skin: Secondary | ICD-10-CM | POA: Diagnosis not present

## 2014-05-29 DIAGNOSIS — C44311 Basal cell carcinoma of skin of nose: Secondary | ICD-10-CM | POA: Diagnosis not present

## 2014-05-29 DIAGNOSIS — L820 Inflamed seborrheic keratosis: Secondary | ICD-10-CM | POA: Diagnosis not present

## 2014-05-29 DIAGNOSIS — Z85828 Personal history of other malignant neoplasm of skin: Secondary | ICD-10-CM | POA: Diagnosis not present

## 2014-06-13 DIAGNOSIS — N529 Male erectile dysfunction, unspecified: Secondary | ICD-10-CM | POA: Diagnosis not present

## 2014-06-13 DIAGNOSIS — Z0001 Encounter for general adult medical examination with abnormal findings: Secondary | ICD-10-CM | POA: Diagnosis not present

## 2014-06-13 DIAGNOSIS — K219 Gastro-esophageal reflux disease without esophagitis: Secondary | ICD-10-CM | POA: Diagnosis not present

## 2014-06-13 DIAGNOSIS — R7301 Impaired fasting glucose: Secondary | ICD-10-CM | POA: Diagnosis not present

## 2014-06-13 DIAGNOSIS — E669 Obesity, unspecified: Secondary | ICD-10-CM | POA: Diagnosis not present

## 2014-06-13 DIAGNOSIS — K2270 Barrett's esophagus without dysplasia: Secondary | ICD-10-CM | POA: Diagnosis not present

## 2014-06-13 DIAGNOSIS — Z1389 Encounter for screening for other disorder: Secondary | ICD-10-CM | POA: Diagnosis not present

## 2014-06-13 DIAGNOSIS — Z86010 Personal history of colonic polyps: Secondary | ICD-10-CM | POA: Diagnosis not present

## 2014-06-13 DIAGNOSIS — Z125 Encounter for screening for malignant neoplasm of prostate: Secondary | ICD-10-CM | POA: Diagnosis not present

## 2014-06-13 DIAGNOSIS — E782 Mixed hyperlipidemia: Secondary | ICD-10-CM | POA: Diagnosis not present

## 2014-06-21 DIAGNOSIS — H26491 Other secondary cataract, right eye: Secondary | ICD-10-CM | POA: Diagnosis not present

## 2014-07-10 DIAGNOSIS — C44311 Basal cell carcinoma of skin of nose: Secondary | ICD-10-CM | POA: Diagnosis not present

## 2014-07-10 DIAGNOSIS — Z85828 Personal history of other malignant neoplasm of skin: Secondary | ICD-10-CM | POA: Diagnosis not present

## 2014-07-17 DIAGNOSIS — L570 Actinic keratosis: Secondary | ICD-10-CM | POA: Diagnosis not present

## 2014-08-09 DIAGNOSIS — L821 Other seborrheic keratosis: Secondary | ICD-10-CM | POA: Diagnosis not present

## 2014-08-09 DIAGNOSIS — L720 Epidermal cyst: Secondary | ICD-10-CM | POA: Diagnosis not present

## 2014-08-09 DIAGNOSIS — D1801 Hemangioma of skin and subcutaneous tissue: Secondary | ICD-10-CM | POA: Diagnosis not present

## 2014-08-09 DIAGNOSIS — Z85828 Personal history of other malignant neoplasm of skin: Secondary | ICD-10-CM | POA: Diagnosis not present

## 2014-08-09 DIAGNOSIS — L820 Inflamed seborrheic keratosis: Secondary | ICD-10-CM | POA: Diagnosis not present

## 2014-12-13 DIAGNOSIS — Z6830 Body mass index (BMI) 30.0-30.9, adult: Secondary | ICD-10-CM | POA: Diagnosis not present

## 2014-12-13 DIAGNOSIS — R7301 Impaired fasting glucose: Secondary | ICD-10-CM | POA: Diagnosis not present

## 2014-12-13 DIAGNOSIS — E669 Obesity, unspecified: Secondary | ICD-10-CM | POA: Diagnosis not present

## 2014-12-13 DIAGNOSIS — E782 Mixed hyperlipidemia: Secondary | ICD-10-CM | POA: Diagnosis not present

## 2014-12-27 DIAGNOSIS — H26491 Other secondary cataract, right eye: Secondary | ICD-10-CM | POA: Diagnosis not present

## 2014-12-27 DIAGNOSIS — H524 Presbyopia: Secondary | ICD-10-CM | POA: Diagnosis not present

## 2015-01-18 DIAGNOSIS — Z86010 Personal history of colonic polyps: Secondary | ICD-10-CM | POA: Diagnosis not present

## 2015-01-18 DIAGNOSIS — K269 Duodenal ulcer, unspecified as acute or chronic, without hemorrhage or perforation: Secondary | ICD-10-CM | POA: Diagnosis not present

## 2015-01-18 DIAGNOSIS — K219 Gastro-esophageal reflux disease without esophagitis: Secondary | ICD-10-CM | POA: Diagnosis not present

## 2015-02-21 DIAGNOSIS — L821 Other seborrheic keratosis: Secondary | ICD-10-CM | POA: Diagnosis not present

## 2015-02-21 DIAGNOSIS — D485 Neoplasm of uncertain behavior of skin: Secondary | ICD-10-CM | POA: Diagnosis not present

## 2015-02-21 DIAGNOSIS — Z85828 Personal history of other malignant neoplasm of skin: Secondary | ICD-10-CM | POA: Diagnosis not present

## 2015-02-21 DIAGNOSIS — C44319 Basal cell carcinoma of skin of other parts of face: Secondary | ICD-10-CM | POA: Diagnosis not present

## 2015-02-21 DIAGNOSIS — D1801 Hemangioma of skin and subcutaneous tissue: Secondary | ICD-10-CM | POA: Diagnosis not present

## 2015-03-20 DIAGNOSIS — Z86010 Personal history of colonic polyps: Secondary | ICD-10-CM | POA: Diagnosis not present

## 2015-03-20 DIAGNOSIS — K5730 Diverticulosis of large intestine without perforation or abscess without bleeding: Secondary | ICD-10-CM | POA: Diagnosis not present

## 2015-03-20 DIAGNOSIS — Z09 Encounter for follow-up examination after completed treatment for conditions other than malignant neoplasm: Secondary | ICD-10-CM | POA: Diagnosis not present

## 2015-04-01 DIAGNOSIS — Z85828 Personal history of other malignant neoplasm of skin: Secondary | ICD-10-CM | POA: Diagnosis not present

## 2015-04-01 DIAGNOSIS — C44319 Basal cell carcinoma of skin of other parts of face: Secondary | ICD-10-CM | POA: Diagnosis not present

## 2015-05-10 DIAGNOSIS — Z23 Encounter for immunization: Secondary | ICD-10-CM | POA: Diagnosis not present

## 2015-07-19 DIAGNOSIS — M8580 Other specified disorders of bone density and structure, unspecified site: Secondary | ICD-10-CM | POA: Diagnosis not present

## 2015-07-19 DIAGNOSIS — Z125 Encounter for screening for malignant neoplasm of prostate: Secondary | ICD-10-CM | POA: Diagnosis not present

## 2015-07-19 DIAGNOSIS — N529 Male erectile dysfunction, unspecified: Secondary | ICD-10-CM | POA: Diagnosis not present

## 2015-07-19 DIAGNOSIS — Z6830 Body mass index (BMI) 30.0-30.9, adult: Secondary | ICD-10-CM | POA: Diagnosis not present

## 2015-07-19 DIAGNOSIS — K2270 Barrett's esophagus without dysplasia: Secondary | ICD-10-CM | POA: Diagnosis not present

## 2015-07-19 DIAGNOSIS — R7301 Impaired fasting glucose: Secondary | ICD-10-CM | POA: Diagnosis not present

## 2015-07-19 DIAGNOSIS — E669 Obesity, unspecified: Secondary | ICD-10-CM | POA: Diagnosis not present

## 2015-07-19 DIAGNOSIS — Z0001 Encounter for general adult medical examination with abnormal findings: Secondary | ICD-10-CM | POA: Diagnosis not present

## 2015-07-19 DIAGNOSIS — Z86010 Personal history of colonic polyps: Secondary | ICD-10-CM | POA: Diagnosis not present

## 2015-07-19 DIAGNOSIS — E782 Mixed hyperlipidemia: Secondary | ICD-10-CM | POA: Diagnosis not present

## 2015-07-19 DIAGNOSIS — K219 Gastro-esophageal reflux disease without esophagitis: Secondary | ICD-10-CM | POA: Diagnosis not present

## 2015-08-29 DIAGNOSIS — M8589 Other specified disorders of bone density and structure, multiple sites: Secondary | ICD-10-CM | POA: Diagnosis not present

## 2015-08-29 DIAGNOSIS — M859 Disorder of bone density and structure, unspecified: Secondary | ICD-10-CM | POA: Diagnosis not present

## 2015-10-07 DIAGNOSIS — L570 Actinic keratosis: Secondary | ICD-10-CM | POA: Diagnosis not present

## 2015-10-07 DIAGNOSIS — L918 Other hypertrophic disorders of the skin: Secondary | ICD-10-CM | POA: Diagnosis not present

## 2015-10-07 DIAGNOSIS — L821 Other seborrheic keratosis: Secondary | ICD-10-CM | POA: Diagnosis not present

## 2015-10-07 DIAGNOSIS — L812 Freckles: Secondary | ICD-10-CM | POA: Diagnosis not present

## 2015-10-07 DIAGNOSIS — D1801 Hemangioma of skin and subcutaneous tissue: Secondary | ICD-10-CM | POA: Diagnosis not present

## 2015-10-07 DIAGNOSIS — Z85828 Personal history of other malignant neoplasm of skin: Secondary | ICD-10-CM | POA: Diagnosis not present

## 2015-10-07 DIAGNOSIS — L820 Inflamed seborrheic keratosis: Secondary | ICD-10-CM | POA: Diagnosis not present

## 2015-12-26 DIAGNOSIS — H524 Presbyopia: Secondary | ICD-10-CM | POA: Diagnosis not present

## 2015-12-26 DIAGNOSIS — H26491 Other secondary cataract, right eye: Secondary | ICD-10-CM | POA: Diagnosis not present

## 2016-01-31 DIAGNOSIS — E782 Mixed hyperlipidemia: Secondary | ICD-10-CM | POA: Diagnosis not present

## 2016-01-31 DIAGNOSIS — E669 Obesity, unspecified: Secondary | ICD-10-CM | POA: Diagnosis not present

## 2016-01-31 DIAGNOSIS — R7301 Impaired fasting glucose: Secondary | ICD-10-CM | POA: Diagnosis not present

## 2016-01-31 DIAGNOSIS — M79673 Pain in unspecified foot: Secondary | ICD-10-CM | POA: Diagnosis not present

## 2016-04-09 DIAGNOSIS — Z86010 Personal history of colonic polyps: Secondary | ICD-10-CM | POA: Diagnosis not present

## 2016-04-09 DIAGNOSIS — K219 Gastro-esophageal reflux disease without esophagitis: Secondary | ICD-10-CM | POA: Diagnosis not present

## 2016-05-06 DIAGNOSIS — Z23 Encounter for immunization: Secondary | ICD-10-CM | POA: Diagnosis not present

## 2016-08-20 DIAGNOSIS — E669 Obesity, unspecified: Secondary | ICD-10-CM | POA: Diagnosis not present

## 2016-08-20 DIAGNOSIS — E782 Mixed hyperlipidemia: Secondary | ICD-10-CM | POA: Diagnosis not present

## 2016-08-20 DIAGNOSIS — Z1159 Encounter for screening for other viral diseases: Secondary | ICD-10-CM | POA: Diagnosis not present

## 2016-08-20 DIAGNOSIS — N529 Male erectile dysfunction, unspecified: Secondary | ICD-10-CM | POA: Diagnosis not present

## 2016-08-20 DIAGNOSIS — R5383 Other fatigue: Secondary | ICD-10-CM | POA: Diagnosis not present

## 2016-08-20 DIAGNOSIS — K219 Gastro-esophageal reflux disease without esophagitis: Secondary | ICD-10-CM | POA: Diagnosis not present

## 2016-08-20 DIAGNOSIS — R7301 Impaired fasting glucose: Secondary | ICD-10-CM | POA: Diagnosis not present

## 2016-08-20 DIAGNOSIS — Z125 Encounter for screening for malignant neoplasm of prostate: Secondary | ICD-10-CM | POA: Diagnosis not present

## 2016-08-20 DIAGNOSIS — M8580 Other specified disorders of bone density and structure, unspecified site: Secondary | ICD-10-CM | POA: Diagnosis not present

## 2016-08-20 DIAGNOSIS — Z0000 Encounter for general adult medical examination without abnormal findings: Secondary | ICD-10-CM | POA: Diagnosis not present

## 2016-10-08 DIAGNOSIS — Z85828 Personal history of other malignant neoplasm of skin: Secondary | ICD-10-CM | POA: Diagnosis not present

## 2016-10-08 DIAGNOSIS — D1801 Hemangioma of skin and subcutaneous tissue: Secondary | ICD-10-CM | POA: Diagnosis not present

## 2016-10-08 DIAGNOSIS — D225 Melanocytic nevi of trunk: Secondary | ICD-10-CM | POA: Diagnosis not present

## 2016-10-08 DIAGNOSIS — L821 Other seborrheic keratosis: Secondary | ICD-10-CM | POA: Diagnosis not present

## 2016-10-08 DIAGNOSIS — L820 Inflamed seborrheic keratosis: Secondary | ICD-10-CM | POA: Diagnosis not present

## 2016-10-08 DIAGNOSIS — L570 Actinic keratosis: Secondary | ICD-10-CM | POA: Diagnosis not present

## 2017-02-11 DIAGNOSIS — H26491 Other secondary cataract, right eye: Secondary | ICD-10-CM | POA: Diagnosis not present

## 2017-02-11 DIAGNOSIS — H524 Presbyopia: Secondary | ICD-10-CM | POA: Diagnosis not present

## 2017-03-18 DIAGNOSIS — Z23 Encounter for immunization: Secondary | ICD-10-CM | POA: Diagnosis not present

## 2017-03-18 DIAGNOSIS — E669 Obesity, unspecified: Secondary | ICD-10-CM | POA: Diagnosis not present

## 2017-03-18 DIAGNOSIS — R7301 Impaired fasting glucose: Secondary | ICD-10-CM | POA: Diagnosis not present

## 2017-03-18 DIAGNOSIS — E782 Mixed hyperlipidemia: Secondary | ICD-10-CM | POA: Diagnosis not present

## 2017-03-18 DIAGNOSIS — M8580 Other specified disorders of bone density and structure, unspecified site: Secondary | ICD-10-CM | POA: Diagnosis not present

## 2017-06-10 ENCOUNTER — Other Ambulatory Visit: Payer: Self-pay | Admitting: Gastroenterology

## 2017-06-10 DIAGNOSIS — Z86010 Personal history of colonic polyps: Secondary | ICD-10-CM | POA: Diagnosis not present

## 2017-06-10 DIAGNOSIS — R109 Unspecified abdominal pain: Secondary | ICD-10-CM

## 2017-06-10 DIAGNOSIS — K219 Gastro-esophageal reflux disease without esophagitis: Secondary | ICD-10-CM

## 2017-06-10 DIAGNOSIS — K449 Diaphragmatic hernia without obstruction or gangrene: Secondary | ICD-10-CM

## 2017-06-23 ENCOUNTER — Ambulatory Visit
Admission: RE | Admit: 2017-06-23 | Discharge: 2017-06-23 | Disposition: A | Discharge status: Home / Self Care | Payer: Medicare Other | Source: Ambulatory Visit | Attending: Gastroenterology | Admitting: Gastroenterology

## 2017-06-23 ENCOUNTER — Other Ambulatory Visit: Payer: Self-pay

## 2017-06-23 DIAGNOSIS — K219 Gastro-esophageal reflux disease without esophagitis: Secondary | ICD-10-CM

## 2017-06-23 DIAGNOSIS — R109 Unspecified abdominal pain: Secondary | ICD-10-CM

## 2017-06-23 DIAGNOSIS — K449 Diaphragmatic hernia without obstruction or gangrene: Secondary | ICD-10-CM

## 2017-07-22 DIAGNOSIS — K219 Gastro-esophageal reflux disease without esophagitis: Secondary | ICD-10-CM | POA: Diagnosis not present

## 2017-09-06 DIAGNOSIS — E782 Mixed hyperlipidemia: Secondary | ICD-10-CM | POA: Diagnosis not present

## 2017-09-06 DIAGNOSIS — Z6830 Body mass index (BMI) 30.0-30.9, adult: Secondary | ICD-10-CM | POA: Diagnosis not present

## 2017-09-06 DIAGNOSIS — N529 Male erectile dysfunction, unspecified: Secondary | ICD-10-CM | POA: Diagnosis not present

## 2017-09-06 DIAGNOSIS — K219 Gastro-esophageal reflux disease without esophagitis: Secondary | ICD-10-CM | POA: Diagnosis not present

## 2017-09-06 DIAGNOSIS — E669 Obesity, unspecified: Secondary | ICD-10-CM | POA: Diagnosis not present

## 2017-09-06 DIAGNOSIS — Z0000 Encounter for general adult medical examination without abnormal findings: Secondary | ICD-10-CM | POA: Diagnosis not present

## 2017-09-06 DIAGNOSIS — R7301 Impaired fasting glucose: Secondary | ICD-10-CM | POA: Diagnosis not present

## 2017-09-06 DIAGNOSIS — M8580 Other specified disorders of bone density and structure, unspecified site: Secondary | ICD-10-CM | POA: Diagnosis not present

## 2017-10-21 DIAGNOSIS — L570 Actinic keratosis: Secondary | ICD-10-CM | POA: Diagnosis not present

## 2017-10-21 DIAGNOSIS — L821 Other seborrheic keratosis: Secondary | ICD-10-CM | POA: Diagnosis not present

## 2017-10-21 DIAGNOSIS — Z85828 Personal history of other malignant neoplasm of skin: Secondary | ICD-10-CM | POA: Diagnosis not present

## 2017-10-21 DIAGNOSIS — D1801 Hemangioma of skin and subcutaneous tissue: Secondary | ICD-10-CM | POA: Diagnosis not present

## 2017-11-11 DIAGNOSIS — M8588 Other specified disorders of bone density and structure, other site: Secondary | ICD-10-CM | POA: Diagnosis not present

## 2017-12-09 DIAGNOSIS — R7301 Impaired fasting glucose: Secondary | ICD-10-CM | POA: Diagnosis not present

## 2017-12-17 DIAGNOSIS — H903 Sensorineural hearing loss, bilateral: Secondary | ICD-10-CM | POA: Diagnosis not present

## 2017-12-17 DIAGNOSIS — H6121 Impacted cerumen, right ear: Secondary | ICD-10-CM | POA: Diagnosis not present

## 2017-12-17 DIAGNOSIS — H6691 Otitis media, unspecified, right ear: Secondary | ICD-10-CM | POA: Diagnosis not present

## 2017-12-17 DIAGNOSIS — H6061 Unspecified chronic otitis externa, right ear: Secondary | ICD-10-CM | POA: Diagnosis not present

## 2017-12-27 DIAGNOSIS — H6061 Unspecified chronic otitis externa, right ear: Secondary | ICD-10-CM | POA: Diagnosis not present

## 2017-12-27 DIAGNOSIS — H6691 Otitis media, unspecified, right ear: Secondary | ICD-10-CM | POA: Diagnosis not present

## 2017-12-27 DIAGNOSIS — H6121 Impacted cerumen, right ear: Secondary | ICD-10-CM | POA: Diagnosis not present

## 2018-01-04 DIAGNOSIS — H6061 Unspecified chronic otitis externa, right ear: Secondary | ICD-10-CM | POA: Diagnosis not present

## 2018-01-04 DIAGNOSIS — H6121 Impacted cerumen, right ear: Secondary | ICD-10-CM | POA: Diagnosis not present

## 2018-02-17 DIAGNOSIS — H524 Presbyopia: Secondary | ICD-10-CM | POA: Diagnosis not present

## 2018-02-17 DIAGNOSIS — Z961 Presence of intraocular lens: Secondary | ICD-10-CM | POA: Diagnosis not present

## 2018-04-21 DIAGNOSIS — Z23 Encounter for immunization: Secondary | ICD-10-CM | POA: Diagnosis not present

## 2018-06-03 DIAGNOSIS — Z974 Presence of external hearing-aid: Secondary | ICD-10-CM | POA: Diagnosis not present

## 2018-06-03 DIAGNOSIS — H903 Sensorineural hearing loss, bilateral: Secondary | ICD-10-CM | POA: Diagnosis not present

## 2018-06-03 DIAGNOSIS — H6121 Impacted cerumen, right ear: Secondary | ICD-10-CM | POA: Diagnosis not present

## 2018-06-03 DIAGNOSIS — H9201 Otalgia, right ear: Secondary | ICD-10-CM | POA: Diagnosis not present

## 2018-07-25 DIAGNOSIS — R05 Cough: Secondary | ICD-10-CM | POA: Diagnosis not present

## 2018-07-25 DIAGNOSIS — R0982 Postnasal drip: Secondary | ICD-10-CM | POA: Diagnosis not present

## 2018-07-25 DIAGNOSIS — J0100 Acute maxillary sinusitis, unspecified: Secondary | ICD-10-CM | POA: Diagnosis not present

## 2018-08-11 DIAGNOSIS — Z86010 Personal history of colonic polyps: Secondary | ICD-10-CM | POA: Diagnosis not present

## 2018-08-11 DIAGNOSIS — K219 Gastro-esophageal reflux disease without esophagitis: Secondary | ICD-10-CM | POA: Diagnosis not present

## 2018-10-27 DIAGNOSIS — D1801 Hemangioma of skin and subcutaneous tissue: Secondary | ICD-10-CM | POA: Diagnosis not present

## 2018-10-27 DIAGNOSIS — Z85828 Personal history of other malignant neoplasm of skin: Secondary | ICD-10-CM | POA: Diagnosis not present

## 2018-10-27 DIAGNOSIS — L720 Epidermal cyst: Secondary | ICD-10-CM | POA: Diagnosis not present

## 2018-10-27 DIAGNOSIS — L570 Actinic keratosis: Secondary | ICD-10-CM | POA: Diagnosis not present

## 2018-10-27 DIAGNOSIS — L821 Other seborrheic keratosis: Secondary | ICD-10-CM | POA: Diagnosis not present

## 2019-02-09 DIAGNOSIS — E782 Mixed hyperlipidemia: Secondary | ICD-10-CM | POA: Diagnosis not present

## 2019-02-09 DIAGNOSIS — R7301 Impaired fasting glucose: Secondary | ICD-10-CM | POA: Diagnosis not present

## 2019-02-16 DIAGNOSIS — K219 Gastro-esophageal reflux disease without esophagitis: Secondary | ICD-10-CM | POA: Diagnosis not present

## 2019-02-16 DIAGNOSIS — Z7189 Other specified counseling: Secondary | ICD-10-CM | POA: Diagnosis not present

## 2019-02-16 DIAGNOSIS — R7301 Impaired fasting glucose: Secondary | ICD-10-CM | POA: Diagnosis not present

## 2019-02-16 DIAGNOSIS — M8580 Other specified disorders of bone density and structure, unspecified site: Secondary | ICD-10-CM | POA: Diagnosis not present

## 2019-02-16 DIAGNOSIS — N529 Male erectile dysfunction, unspecified: Secondary | ICD-10-CM | POA: Diagnosis not present

## 2019-02-16 DIAGNOSIS — E782 Mixed hyperlipidemia: Secondary | ICD-10-CM | POA: Diagnosis not present

## 2019-02-16 DIAGNOSIS — Z0000 Encounter for general adult medical examination without abnormal findings: Secondary | ICD-10-CM | POA: Diagnosis not present

## 2019-03-02 DIAGNOSIS — H524 Presbyopia: Secondary | ICD-10-CM | POA: Diagnosis not present

## 2019-03-02 DIAGNOSIS — Z961 Presence of intraocular lens: Secondary | ICD-10-CM | POA: Diagnosis not present

## 2019-03-30 DIAGNOSIS — Z23 Encounter for immunization: Secondary | ICD-10-CM | POA: Diagnosis not present

## 2019-07-22 ENCOUNTER — Ambulatory Visit: Payer: Medicare Other | Attending: Internal Medicine

## 2019-07-22 DIAGNOSIS — Z23 Encounter for immunization: Secondary | ICD-10-CM | POA: Insufficient documentation

## 2019-07-22 NOTE — Progress Notes (Signed)
   Covid-19 Vaccination Clinic  Name:  Michael Brock    MRN: [id number] DOB: August 21, 1940  07/22/2019  [name] was observed post Covid-19 immunization for 15 minutes without incidence. He was provided with Vaccine Information Sheet and instruction to access the V-Safe system.   [name] was instructed to call 911 with any severe reactions post vaccine: Marland Kitchen Difficulty breathing  . Swelling of your face and throat  . A fast heartbeat  . A bad rash all over your body  . Dizziness and weakness    Immunizations Administered    Name Date Dose VIS Date Route   Pfizer COVID-19 Vaccine 07/22/2019  2:37 PM 0.3 mL 06/23/2019 Intramuscular   Manufacturer: [place]   Lot: [id number]   [place]: [id number]

## 2019-07-27 ENCOUNTER — Ambulatory Visit: Payer: No Typology Code available for payment source

## 2019-08-01 DIAGNOSIS — Z85828 Personal history of other malignant neoplasm of skin: Secondary | ICD-10-CM | POA: Diagnosis not present

## 2019-08-01 DIAGNOSIS — C44222 Squamous cell carcinoma of skin of right ear and external auricular canal: Secondary | ICD-10-CM | POA: Diagnosis not present

## 2019-08-01 DIAGNOSIS — D485 Neoplasm of uncertain behavior of skin: Secondary | ICD-10-CM | POA: Diagnosis not present

## 2019-08-02 DIAGNOSIS — R7309 Other abnormal glucose: Secondary | ICD-10-CM | POA: Diagnosis not present

## 2019-08-02 DIAGNOSIS — R5383 Other fatigue: Secondary | ICD-10-CM | POA: Diagnosis not present

## 2019-08-02 DIAGNOSIS — E782 Mixed hyperlipidemia: Secondary | ICD-10-CM | POA: Diagnosis not present

## 2019-08-03 DIAGNOSIS — R5383 Other fatigue: Secondary | ICD-10-CM | POA: Diagnosis not present

## 2019-08-03 DIAGNOSIS — H8110 Benign paroxysmal vertigo, unspecified ear: Secondary | ICD-10-CM | POA: Diagnosis not present

## 2019-08-03 DIAGNOSIS — R7301 Impaired fasting glucose: Secondary | ICD-10-CM | POA: Diagnosis not present

## 2019-08-03 DIAGNOSIS — E782 Mixed hyperlipidemia: Secondary | ICD-10-CM | POA: Diagnosis not present

## 2019-08-03 DIAGNOSIS — Z7189 Other specified counseling: Secondary | ICD-10-CM | POA: Diagnosis not present

## 2019-08-10 ENCOUNTER — Ambulatory Visit: Payer: No Typology Code available for payment source

## 2019-08-12 ENCOUNTER — Ambulatory Visit: Payer: Medicare Other | Attending: Internal Medicine

## 2019-08-12 DIAGNOSIS — Z23 Encounter for immunization: Secondary | ICD-10-CM | POA: Insufficient documentation

## 2019-08-12 NOTE — Progress Notes (Signed)
   Covid-19 Vaccination Clinic  Name:  Michael Brock    MRN: [id number] DOB: 11-Dec-1940  08/12/2019  Mr. Michael Brock was observed post Covid-19 immunization for 15 minutes without incidence. He was provided with Vaccine Information Sheet and instruction to access the V-Safe system.   Mr. Michael Brock was instructed to call 911 with any severe reactions post vaccine: Marland Kitchen Difficulty breathing  . Swelling of your face and throat  . A fast heartbeat  . A bad rash all over your body  . Dizziness and weakness    Immunizations Administered    Name Date Dose VIS Date Route   Pfizer COVID-19 Vaccine 08/12/2019 10:42 AM 0.3 mL 06/23/2019 Intramuscular   Manufacturer: [place]   Lot: [id number]   [employer]: [id number]

## 2019-08-17 DIAGNOSIS — C44222 Squamous cell carcinoma of skin of right ear and external auricular canal: Secondary | ICD-10-CM | POA: Diagnosis not present

## 2019-08-17 DIAGNOSIS — Z85828 Personal history of other malignant neoplasm of skin: Secondary | ICD-10-CM | POA: Diagnosis not present

## 2019-10-31 DIAGNOSIS — L570 Actinic keratosis: Secondary | ICD-10-CM | POA: Diagnosis not present

## 2019-10-31 DIAGNOSIS — L738 Other specified follicular disorders: Secondary | ICD-10-CM | POA: Diagnosis not present

## 2019-10-31 DIAGNOSIS — Z85828 Personal history of other malignant neoplasm of skin: Secondary | ICD-10-CM | POA: Diagnosis not present

## 2019-10-31 DIAGNOSIS — L812 Freckles: Secondary | ICD-10-CM | POA: Diagnosis not present

## 2019-10-31 DIAGNOSIS — L821 Other seborrheic keratosis: Secondary | ICD-10-CM | POA: Diagnosis not present

## 2019-10-31 DIAGNOSIS — D1801 Hemangioma of skin and subcutaneous tissue: Secondary | ICD-10-CM | POA: Diagnosis not present

## 2019-11-02 DIAGNOSIS — Z86010 Personal history of colonic polyps: Secondary | ICD-10-CM | POA: Diagnosis not present

## 2019-11-02 DIAGNOSIS — K219 Gastro-esophageal reflux disease without esophagitis: Secondary | ICD-10-CM | POA: Diagnosis not present

## 2020-02-06 DIAGNOSIS — K5730 Diverticulosis of large intestine without perforation or abscess without bleeding: Secondary | ICD-10-CM | POA: Diagnosis not present

## 2020-02-06 DIAGNOSIS — Z86010 Personal history of colonic polyps: Secondary | ICD-10-CM | POA: Diagnosis not present

## 2020-02-06 DIAGNOSIS — K648 Other hemorrhoids: Secondary | ICD-10-CM | POA: Diagnosis not present

## 2020-03-12 ENCOUNTER — Ambulatory Visit
Admission: RE | Admit: 2020-03-12 | Discharge: 2020-03-12 | Disposition: A | Discharge status: Home / Self Care | Payer: Medicare Other | Source: Ambulatory Visit | Attending: Family Medicine | Admitting: Family Medicine

## 2020-03-12 ENCOUNTER — Other Ambulatory Visit: Payer: Self-pay | Admitting: Family Medicine

## 2020-03-12 DIAGNOSIS — K219 Gastro-esophageal reflux disease without esophagitis: Secondary | ICD-10-CM | POA: Diagnosis not present

## 2020-03-12 DIAGNOSIS — Z77090 Contact with and (suspected) exposure to asbestos: Secondary | ICD-10-CM

## 2020-03-12 DIAGNOSIS — E782 Mixed hyperlipidemia: Secondary | ICD-10-CM | POA: Diagnosis not present

## 2020-03-12 DIAGNOSIS — R7301 Impaired fasting glucose: Secondary | ICD-10-CM | POA: Diagnosis not present

## 2020-03-12 DIAGNOSIS — Z0000 Encounter for general adult medical examination without abnormal findings: Secondary | ICD-10-CM | POA: Diagnosis not present

## 2020-03-12 DIAGNOSIS — N529 Male erectile dysfunction, unspecified: Secondary | ICD-10-CM | POA: Diagnosis not present

## 2020-03-12 DIAGNOSIS — K449 Diaphragmatic hernia without obstruction or gangrene: Secondary | ICD-10-CM | POA: Diagnosis not present

## 2020-03-12 DIAGNOSIS — M8580 Other specified disorders of bone density and structure, unspecified site: Secondary | ICD-10-CM | POA: Diagnosis not present

## 2020-03-12 DIAGNOSIS — J984 Other disorders of lung: Secondary | ICD-10-CM | POA: Diagnosis not present

## 2020-03-14 ENCOUNTER — Other Ambulatory Visit: Payer: Self-pay | Admitting: Family Medicine

## 2020-03-14 DIAGNOSIS — M8580 Other specified disorders of bone density and structure, unspecified site: Secondary | ICD-10-CM

## 2020-03-27 DIAGNOSIS — Z20828 Contact with and (suspected) exposure to other viral communicable diseases: Secondary | ICD-10-CM | POA: Diagnosis not present

## 2020-03-29 ENCOUNTER — Other Ambulatory Visit: Payer: Self-pay | Admitting: Family Medicine

## 2020-03-29 DIAGNOSIS — R9389 Abnormal findings on diagnostic imaging of other specified body structures: Secondary | ICD-10-CM

## 2020-04-04 DIAGNOSIS — Z23 Encounter for immunization: Secondary | ICD-10-CM | POA: Diagnosis not present

## 2020-04-11 ENCOUNTER — Ambulatory Visit
Admission: RE | Admit: 2020-04-11 | Discharge: 2020-04-11 | Disposition: A | Discharge status: Home / Self Care | Payer: Medicare Other | Source: Ambulatory Visit | Attending: Family Medicine | Admitting: Family Medicine

## 2020-04-11 ENCOUNTER — Other Ambulatory Visit: Payer: Self-pay

## 2020-04-11 DIAGNOSIS — J984 Other disorders of lung: Secondary | ICD-10-CM | POA: Diagnosis not present

## 2020-04-11 DIAGNOSIS — R9389 Abnormal findings on diagnostic imaging of other specified body structures: Secondary | ICD-10-CM

## 2020-04-11 DIAGNOSIS — R918 Other nonspecific abnormal finding of lung field: Secondary | ICD-10-CM | POA: Diagnosis not present

## 2020-04-11 DIAGNOSIS — I712 Thoracic aortic aneurysm, without rupture: Secondary | ICD-10-CM | POA: Diagnosis not present

## 2020-04-11 DIAGNOSIS — J479 Bronchiectasis, uncomplicated: Secondary | ICD-10-CM | POA: Diagnosis not present

## 2020-04-11 MED ORDER — IOPAMIDOL (ISOVUE-300) INJECTION 61%
75.0000 mL | Freq: Once | INTRAVENOUS | Status: AC | PRN
  Administered 2020-04-11: 75 mL via INTRAVENOUS

## 2020-04-27 ENCOUNTER — Ambulatory Visit: Payer: Medicare Other | Attending: Internal Medicine

## 2020-04-27 DIAGNOSIS — Z23 Encounter for immunization: Secondary | ICD-10-CM

## 2020-04-27 NOTE — Progress Notes (Signed)
   Covid-19 Vaccination Clinic  Name:  Michael Brock    MRN: [id number] DOB: 09/04/1940  04/27/2020  [name] was observed post Covid-19 immunization for 15 minutes without incident. He was provided with Vaccine Information Sheet and instruction to access the V-Safe system.   [name] was instructed to call 911 with any severe reactions post vaccine: Marland Kitchen Difficulty breathing  . Swelling of face and throat  . A fast heartbeat  . A bad rash all over body  . Dizziness and weakness

## 2020-05-02 DIAGNOSIS — L821 Other seborrheic keratosis: Secondary | ICD-10-CM | POA: Diagnosis not present

## 2020-05-02 DIAGNOSIS — D485 Neoplasm of uncertain behavior of skin: Secondary | ICD-10-CM | POA: Diagnosis not present

## 2020-05-02 DIAGNOSIS — C44219 Basal cell carcinoma of skin of left ear and external auricular canal: Secondary | ICD-10-CM | POA: Diagnosis not present

## 2020-05-02 DIAGNOSIS — Z85828 Personal history of other malignant neoplasm of skin: Secondary | ICD-10-CM | POA: Diagnosis not present

## 2020-05-20 DIAGNOSIS — H02834 Dermatochalasis of left upper eyelid: Secondary | ICD-10-CM | POA: Diagnosis not present

## 2020-05-20 DIAGNOSIS — H02831 Dermatochalasis of right upper eyelid: Secondary | ICD-10-CM | POA: Diagnosis not present

## 2020-05-20 DIAGNOSIS — H43393 Other vitreous opacities, bilateral: Secondary | ICD-10-CM | POA: Diagnosis not present

## 2020-05-20 DIAGNOSIS — Z961 Presence of intraocular lens: Secondary | ICD-10-CM | POA: Diagnosis not present

## 2020-05-30 DIAGNOSIS — C44219 Basal cell carcinoma of skin of left ear and external auricular canal: Secondary | ICD-10-CM | POA: Diagnosis not present

## 2020-05-30 DIAGNOSIS — Z85828 Personal history of other malignant neoplasm of skin: Secondary | ICD-10-CM | POA: Diagnosis not present

## 2020-06-25 ENCOUNTER — Other Ambulatory Visit: Payer: Self-pay

## 2020-06-25 ENCOUNTER — Ambulatory Visit
Admission: RE | Admit: 2020-06-25 | Discharge: 2020-06-25 | Disposition: A | Discharge status: Home / Self Care | Payer: Medicare Other | Source: Ambulatory Visit | Attending: Family Medicine | Admitting: Family Medicine

## 2020-06-25 DIAGNOSIS — M8580 Other specified disorders of bone density and structure, unspecified site: Secondary | ICD-10-CM

## 2020-06-25 DIAGNOSIS — M8589 Other specified disorders of bone density and structure, multiple sites: Secondary | ICD-10-CM | POA: Diagnosis not present

## 2020-09-17 DIAGNOSIS — E782 Mixed hyperlipidemia: Secondary | ICD-10-CM | POA: Diagnosis not present

## 2020-09-17 DIAGNOSIS — R7301 Impaired fasting glucose: Secondary | ICD-10-CM | POA: Diagnosis not present

## 2020-09-17 DIAGNOSIS — I712 Thoracic aortic aneurysm, without rupture: Secondary | ICD-10-CM | POA: Diagnosis not present

## 2020-09-17 DIAGNOSIS — K449 Diaphragmatic hernia without obstruction or gangrene: Secondary | ICD-10-CM | POA: Diagnosis not present

## 2020-09-17 DIAGNOSIS — I700 Atherosclerosis of aorta: Secondary | ICD-10-CM | POA: Diagnosis not present

## 2020-09-17 DIAGNOSIS — I2721 Secondary pulmonary arterial hypertension: Secondary | ICD-10-CM | POA: Diagnosis not present

## 2020-09-17 DIAGNOSIS — K8020 Calculus of gallbladder without cholecystitis without obstruction: Secondary | ICD-10-CM | POA: Diagnosis not present

## 2021-01-07 DIAGNOSIS — Z85828 Personal history of other malignant neoplasm of skin: Secondary | ICD-10-CM | POA: Diagnosis not present

## 2021-01-07 DIAGNOSIS — L570 Actinic keratosis: Secondary | ICD-10-CM | POA: Diagnosis not present

## 2021-01-07 DIAGNOSIS — I788 Other diseases of capillaries: Secondary | ICD-10-CM | POA: Diagnosis not present

## 2021-01-07 DIAGNOSIS — L821 Other seborrheic keratosis: Secondary | ICD-10-CM | POA: Diagnosis not present

## 2021-03-13 DIAGNOSIS — Z86010 Personal history of colonic polyps: Secondary | ICD-10-CM | POA: Diagnosis not present

## 2021-03-13 DIAGNOSIS — K219 Gastro-esophageal reflux disease without esophagitis: Secondary | ICD-10-CM | POA: Diagnosis not present

## 2021-03-20 DIAGNOSIS — I2721 Secondary pulmonary arterial hypertension: Secondary | ICD-10-CM | POA: Diagnosis not present

## 2021-03-20 DIAGNOSIS — R7301 Impaired fasting glucose: Secondary | ICD-10-CM | POA: Diagnosis not present

## 2021-03-20 DIAGNOSIS — Z77090 Contact with and (suspected) exposure to asbestos: Secondary | ICD-10-CM | POA: Diagnosis not present

## 2021-03-20 DIAGNOSIS — M8580 Other specified disorders of bone density and structure, unspecified site: Secondary | ICD-10-CM | POA: Diagnosis not present

## 2021-03-20 DIAGNOSIS — E782 Mixed hyperlipidemia: Secondary | ICD-10-CM | POA: Diagnosis not present

## 2021-03-20 DIAGNOSIS — J849 Interstitial pulmonary disease, unspecified: Secondary | ICD-10-CM | POA: Diagnosis not present

## 2021-03-20 DIAGNOSIS — I712 Thoracic aortic aneurysm, without rupture: Secondary | ICD-10-CM | POA: Diagnosis not present

## 2021-03-20 DIAGNOSIS — K219 Gastro-esophageal reflux disease without esophagitis: Secondary | ICD-10-CM | POA: Diagnosis not present

## 2021-03-20 DIAGNOSIS — Z0000 Encounter for general adult medical examination without abnormal findings: Secondary | ICD-10-CM | POA: Diagnosis not present

## 2021-03-25 ENCOUNTER — Other Ambulatory Visit: Payer: Self-pay | Admitting: Family Medicine

## 2021-03-25 DIAGNOSIS — I712 Thoracic aortic aneurysm, without rupture, unspecified: Secondary | ICD-10-CM

## 2021-04-18 DIAGNOSIS — Z23 Encounter for immunization: Secondary | ICD-10-CM | POA: Diagnosis not present

## 2021-04-24 ENCOUNTER — Institutional Professional Consult (permissible substitution): Payer: Medicare Other | Admitting: Pulmonary Disease

## 2021-04-28 ENCOUNTER — Ambulatory Visit
Admission: RE | Admit: 2021-04-28 | Discharge: 2021-04-28 | Disposition: A | Discharge status: Home / Self Care | Payer: Medicare Other | Source: Ambulatory Visit | Attending: Family Medicine | Admitting: Family Medicine

## 2021-04-28 ENCOUNTER — Other Ambulatory Visit: Payer: Self-pay

## 2021-04-28 DIAGNOSIS — I712 Thoracic aortic aneurysm, without rupture, unspecified: Secondary | ICD-10-CM

## 2021-04-28 DIAGNOSIS — K8020 Calculus of gallbladder without cholecystitis without obstruction: Secondary | ICD-10-CM | POA: Diagnosis not present

## 2021-04-28 DIAGNOSIS — I700 Atherosclerosis of aorta: Secondary | ICD-10-CM | POA: Diagnosis not present

## 2021-04-28 DIAGNOSIS — K449 Diaphragmatic hernia without obstruction or gangrene: Secondary | ICD-10-CM | POA: Diagnosis not present

## 2021-04-28 DIAGNOSIS — I719 Aortic aneurysm of unspecified site, without rupture: Secondary | ICD-10-CM | POA: Diagnosis not present

## 2021-04-28 MED ORDER — IOPAMIDOL (ISOVUE-370) INJECTION 76%
75.0000 mL | Freq: Once | INTRAVENOUS | Status: AC | PRN
  Administered 2021-04-28: 75 mL via INTRAVENOUS

## 2021-05-13 ENCOUNTER — Other Ambulatory Visit: Payer: Self-pay

## 2021-05-13 ENCOUNTER — Ambulatory Visit (INDEPENDENT_AMBULATORY_CARE_PROVIDER_SITE_OTHER): Payer: Medicare Other | Admitting: Pulmonary Disease

## 2021-05-13 VITALS — BP 118/74 | HR 84 | Temp 97.4°F | Ht 71.5 in | Wt 217.0 lb

## 2021-05-13 DIAGNOSIS — J849 Interstitial pulmonary disease, unspecified: Secondary | ICD-10-CM

## 2021-05-13 NOTE — Progress Notes (Signed)
Michael Brock    [id number]    1940-10-01  Primary Care Physician:Michael Brock, Michael May, MD  Referring Physician: Mayra Neer, MD [address] [place],  [place] [address]  Chief complaint: Consult for dyspnea  HPI: 80 year old with history of hypertension hyperlipidemia, GERD Referred for evaluation of dyspnea and abdominal CT scan Complains of worsening dyspnea on exertion for the past 6 months.  No symptoms at rest Denies any cough, fevers, chills He had a CT scan done in September 2021 as a follow-up for abnormal chest x-ray.  The scan showed basal fibrotic changes.   Pets: Dog Occupation: Retired from [employer].  He used to work in Architect Exposures: Reports exposure to asbestos both in [employer] and in [employer] ILD questionnaire 05/15/21-negative except for exposure to asbestos as above Smoking history: Used to smoke while in college Travel history: No significant travel history Relevant family history: Mother had COPD and lung cancer.  She was a smoker.   Outpatient Encounter Medications as of 05/13/2021  Medication Sig   amLODipine (NORVASC) 5 MG tablet Take 5 mg by mouth daily.   aspirin 81 MG tablet Take 81 mg by mouth daily.   calcium carbonate (OS-CAL) 600 MG TABS tablet Take 600 mg by mouth 2 (two) times daily with a meal.   cetirizine (ZYRTEC) 10 MG tablet Take 10 mg by mouth daily.   Cholecalciferol (VITAMIN D3) 3000 UNITS TABS Take by mouth.   CINNAMON PO Take by mouth.   esomeprazole (NEXIUM) 40 MG capsule Take 40 mg by mouth daily before breakfast.   Garlic 1660 MG CAPS Take by mouth.   Psyllium (METAMUCIL PO) Take by mouth.   rosuvastatin (CRESTOR) 5 MG tablet Take 5 mg by mouth daily.   Flaxseed, Linseed, (RA FLAX SEED OIL) 1000 MG CAPS Take by mouth.   [DISCONTINUED] beta carotene w/minerals (OCUVITE) tablet Take 1 tablet by mouth daily.   [DISCONTINUED] HYDROcodone-acetaminophen (NORCO) 5-325 MG per tablet Take 1 tablet by mouth  every 6 (six) hours as needed for pain.   No facility-administered encounter medications on file as of 05/13/2021.    Allergies as of 05/13/2021   (No Known Allergies)    Past Medical History:  Diagnosis Date   GERD (gastroesophageal reflux disease)    Hyperlipidemia     Past Surgical History:  Procedure Laterality Date   COLON SURGERY     HERNIA REPAIR      No family history on file.  Social History   Socioeconomic History   Marital status: Married    Spouse name: Not on file   Number of children: Not on file   Years of education: Not on file   Highest education level: Not on file  Occupational History   Not on file  Tobacco Use   Smoking status: Former   Smokeless tobacco: Not on file  Substance and Sexual Activity   Alcohol use: No   Drug use: No   Sexual activity: Not on file  Other Topics Concern   Not on file  Social History Narrative   Not on file   Social Determinants of Health   Financial Resource Strain: Not on file  Food Insecurity: Not on file  Transportation Needs: Not on file  Physical Activity: Not on file  Stress: Not on file  Social Connections: Not on file  Intimate Partner Violence: Not on file    Review of systems: Review of Systems  Constitutional: Negative for  fever and chills.  HENT: Negative.   Eyes: Negative for blurred vision.  Respiratory: as per HPI  Cardiovascular: Negative for chest pain and palpitations.  Gastrointestinal: Negative for vomiting, diarrhea, blood per rectum. Genitourinary: Negative for dysuria, urgency, frequency and hematuria.  Musculoskeletal: Negative for myalgias, back pain and joint pain.  Skin: Negative for itching and rash.  Neurological: Negative for dizziness, tremors, focal weakness, seizures and loss of consciousness.  Endo/Heme/Allergies: Negative for environmental allergies.  Psychiatric/Behavioral: Negative for depression, suicidal ideas and hallucinations.  All other systems reviewed and  are negative.  Physical Exam: Blood pressure 118/74, pulse 84, temperature (!) 97.4 F (36.3 C), temperature source Oral, height 5' 11.5" (1.816 m), weight 217 lb (98.4 kg), SpO2 98 %. Gen:      No acute distress HEENT:  EOMI, sclera anicteric Neck:     No masses; no thyromegaly Lungs:    Clear to auscultation bilaterally; normal respiratory effort CV:         Regular rate and rhythm; no murmurs Abd:      + bowel sounds; soft, non-tender; no palpable masses, no distension Ext:    No edema; adequate peripheral perfusion Skin:      Warm and dry; no rash Neuro: alert and oriented x 3 Psych: normal mood and affect  Data Reviewed: Imaging: CT chest 04/11/2020-mild basilar subpleural reticular opacities, traction bronchiectasis and probable UIP pattern Moderate hiatal hernia, aortic aneurysm.   CTA 04/28/2021-mild basal reticulation in the anterior side.  Appears unchanged from prior I have reviewed the images personally.  PFTs:   Labs:  Assessment:  Assessment for interstitial lung disease He has history of asbestos exposure Review of his CTs from 48 21-20 22 shows minimal subpleural reticulation in the anterior aspect of the lower lobes.  I am not sure if this represents significant interstitial lung disease Will get high-resolution CT and PFTs for better evaluation  Plan/Recommendations: High-res CT, PFTs  Marshell Garfinkel MD [facility] 05/13/2021, 11:05 AM  CC: Michael Neer, MD

## 2021-05-13 NOTE — Patient Instructions (Signed)
We get a high-resolution CT, pulmonary function test for evaluation of the lungs Follow-up in clinic after this test in 1 to 2 months for review.

## 2021-05-15 ENCOUNTER — Encounter: Payer: Self-pay | Admitting: Pulmonary Disease

## 2021-05-15 DIAGNOSIS — C44311 Basal cell carcinoma of skin of nose: Secondary | ICD-10-CM | POA: Diagnosis not present

## 2021-05-15 DIAGNOSIS — D485 Neoplasm of uncertain behavior of skin: Secondary | ICD-10-CM | POA: Diagnosis not present

## 2021-05-15 DIAGNOSIS — Z85828 Personal history of other malignant neoplasm of skin: Secondary | ICD-10-CM | POA: Diagnosis not present

## 2021-05-22 DIAGNOSIS — H35371 Puckering of macula, right eye: Secondary | ICD-10-CM | POA: Diagnosis not present

## 2021-05-22 DIAGNOSIS — H43393 Other vitreous opacities, bilateral: Secondary | ICD-10-CM | POA: Diagnosis not present

## 2021-05-22 DIAGNOSIS — Z961 Presence of intraocular lens: Secondary | ICD-10-CM | POA: Diagnosis not present

## 2021-05-22 DIAGNOSIS — H524 Presbyopia: Secondary | ICD-10-CM | POA: Diagnosis not present

## 2021-05-22 DIAGNOSIS — H02831 Dermatochalasis of right upper eyelid: Secondary | ICD-10-CM | POA: Diagnosis not present

## 2021-05-27 ENCOUNTER — Ambulatory Visit
Admission: RE | Admit: 2021-05-27 | Discharge: 2021-05-27 | Disposition: A | Discharge status: Home / Self Care | Payer: Medicare Other | Source: Ambulatory Visit | Attending: Pulmonary Disease | Admitting: Pulmonary Disease

## 2021-05-27 ENCOUNTER — Other Ambulatory Visit: Payer: Self-pay

## 2021-05-27 DIAGNOSIS — K449 Diaphragmatic hernia without obstruction or gangrene: Secondary | ICD-10-CM | POA: Diagnosis not present

## 2021-05-27 DIAGNOSIS — J849 Interstitial pulmonary disease, unspecified: Secondary | ICD-10-CM

## 2021-05-27 DIAGNOSIS — J929 Pleural plaque without asbestos: Secondary | ICD-10-CM | POA: Diagnosis not present

## 2021-05-27 DIAGNOSIS — J479 Bronchiectasis, uncomplicated: Secondary | ICD-10-CM | POA: Diagnosis not present

## 2021-05-27 DIAGNOSIS — I2510 Atherosclerotic heart disease of native coronary artery without angina pectoris: Secondary | ICD-10-CM | POA: Diagnosis not present

## 2021-06-19 DIAGNOSIS — Z85828 Personal history of other malignant neoplasm of skin: Secondary | ICD-10-CM | POA: Diagnosis not present

## 2021-06-19 DIAGNOSIS — C44311 Basal cell carcinoma of skin of nose: Secondary | ICD-10-CM | POA: Diagnosis not present

## 2021-06-27 ENCOUNTER — Encounter: Payer: Self-pay | Admitting: Pulmonary Disease

## 2021-06-27 ENCOUNTER — Ambulatory Visit (INDEPENDENT_AMBULATORY_CARE_PROVIDER_SITE_OTHER): Payer: Medicare Other | Admitting: Pulmonary Disease

## 2021-06-27 ENCOUNTER — Other Ambulatory Visit: Payer: Self-pay

## 2021-06-27 VITALS — BP 120/70 | HR 63 | Temp 98.2°F | Ht 70.0 in | Wt 222.2 lb

## 2021-06-27 DIAGNOSIS — J849 Interstitial pulmonary disease, unspecified: Secondary | ICD-10-CM

## 2021-06-27 LAB — PULMONARY FUNCTION TEST
DL/VA % pred: 102 %
DL/VA: 3.97 ml/min/mmHg/L
DLCO cor % pred: 86 %
DLCO cor: 21.12 ml/min/mmHg
DLCO unc % pred: 86 %
DLCO unc: 21.12 ml/min/mmHg
FEF 25-75 Post: 2.43 L/sec
FEF 25-75 Pre: 1.37 L/sec
FEF2575-%Change-Post: 77 %
FEF2575-%Pred-Post: 122 %
FEF2575-%Pred-Pre: 69 %
FEV1-%Change-Post: 12 %
FEV1-%Pred-Post: 82 %
FEV1-%Pred-Pre: 72 %
FEV1-Post: 2.36 L
FEV1-Pre: 2.09 L
FEV1FVC-%Change-Post: 4 %
FEV1FVC-%Pred-Pre: 101 %
FEV6-%Change-Post: 9 %
FEV6-%Pred-Post: 82 %
FEV6-%Pred-Pre: 75 %
FEV6-Post: 3.1 L
FEV6-Pre: 2.82 L
FEV6FVC-%Change-Post: 1 %
FEV6FVC-%Pred-Post: 106 %
FEV6FVC-%Pred-Pre: 104 %
FVC-%Change-Post: 7 %
FVC-%Pred-Post: 77 %
FVC-%Pred-Pre: 71 %
FVC-Post: 3.12 L
FVC-Pre: 2.89 L
Post FEV1/FVC ratio: 76 %
Post FEV6/FVC ratio: 99 %
Pre FEV1/FVC ratio: 72 %
Pre FEV6/FVC Ratio: 98 %
RV % pred: 102 %
RV: 2.73 L
TLC % pred: 84 %
TLC: 5.99 L

## 2021-06-27 NOTE — Progress Notes (Signed)
Michael Brock    [id number]    1940/07/20  Primary Care Physician:Shaw, Nathen May, MD  Referring Physician: Mayra Neer, MD [address] [place],  [place] [address]  Chief complaint: Consult for dyspnea  HPI: 80 year old with history of hypertension hyperlipidemia, GERD Referred for evaluation of dyspnea and abdominal CT scan Complains of worsening dyspnea on exertion for the past 6 months.  No symptoms at rest Denies any cough, fevers, chills He had a CT scan done in September 2021 as a follow-up for abnormal chest x-ray.  The scan showed basal fibrotic changes.   Pets: Dog Occupation: Retired from [employer].  He used to work in [employer] Exposures: Reports exposure to asbestos both in [employer] and in [employer] IL[employer] questionnaire 05/15/21-negative except for exposure to asbestos as above Smoking history: Used to smoke while in college Travel history: No significant travel history Relevant family history: Mother had COPD and lung cancer.  She was a smoker.   Outpatient Encounter Medications as of 06/27/2021  Medication Sig   amLODipine (NORVASC) 5 MG tablet Take 5 mg by mouth daily.   aspirin 81 MG tablet Take 81 mg by mouth daily.   calcium carbonate (OS-CAL) 600 MG TABS tablet Take 600 mg by mouth 2 (two) times daily with a meal.   cetirizine (ZYRTEC) 10 MG tablet Take 10 mg by mouth daily.   Cholecalciferol (VITAMIN D3) 3000 UNITS TABS Take by mouth.   CINNAMON PO Take by mouth.   esomeprazole (NEXIUM) 40 MG capsule Take 40 mg by mouth at bedtime.   Garlic 1062 MG CAPS Take by mouth.   Psyllium (METAMUCIL PO) Take by mouth.   rosuvastatin (CRESTOR) 5 MG tablet Take 5 mg by mouth daily.   [DISCONTINUED] Flaxseed, Linseed, (RA FLAX SEED OIL) 1000 MG CAPS Take by mouth.   No facility-administered encounter medications on file as of 06/27/2021.    Allergies as of 06/27/2021   (No Known Allergies)    Past Medical History:  Diagnosis Date    GERD (gastroesophageal reflux disease)    Hyperlipidemia     Past Surgical History:  Procedure Laterality Date   COLON SURGERY     HERNIA REPAIR      No family history on file.  Social History   Socioeconomic History   Marital status: Married    Spouse name: Not on file   Number of children: Not on file   Years of education: Not on file   Highest education level: Not on file  Occupational History   Not on file  Tobacco Use   Smoking status: Former   Smokeless tobacco: Never  Substance and Sexual Activity   Alcohol use: No   Drug use: No   Sexual activity: Not on file  Other Topics Concern   Not on file  Social History Narrative   Not on file   Social Determinants of Health   Financial Resource Strain: Not on file  Food Insecurity: Not on file  Transportation Needs: Not on file  Physical Activity: Not on file  Stress: Not on file  Social Connections: Not on file  Intimate Partner Violence: Not on file    Review of systems: Review of Systems  Constitutional: Negative for fever and chills.  HENT: Negative.   Eyes: Negative for blurred vision.  Respiratory: as per HPI  Cardiovascular: Negative for chest pain and palpitations.  Gastrointestinal: Negative for vomiting, diarrhea, blood per rectum. Genitourinary: Negative for  dysuria, urgency, frequency and hematuria.  Musculoskeletal: Negative for myalgias, back pain and joint pain.  Skin: Negative for itching and rash.  Neurological: Negative for dizziness, tremors, focal weakness, seizures and loss of consciousness.  Endo/Heme/Allergies: Negative for environmental allergies.  Psychiatric/Behavioral: Negative for depression, suicidal ideas and hallucinations.  All other systems reviewed and are negative.  Physical Exam: Blood pressure 118/74, pulse 84, temperature (!) 97.4 F (36.3 C), temperature source Oral, height 5' 11.5" (1.816 m), weight 217 lb (98.4 kg), SpO2 98 %. Gen:      No acute distress HEENT:   EOMI, sclera anicteric Neck:     No masses; no thyromegaly Lungs:    Clear to auscultation bilaterally; normal respiratory effort CV:         Regular rate and rhythm; no murmurs Abd:      + bowel sounds; soft, non-tender; no palpable masses, no distension Ext:    No edema; adequate peripheral perfusion Skin:      Warm and dry; no rash Neuro: alert and oriented x 3 Psych: normal mood and affect  Data Reviewed: Imaging: CT chest 04/11/2020-mild basilar subpleural reticular opacities, traction bronchiectasis and probable UIP pattern Moderate hiatal hernia, aortic aneurysm.   CTA 04/28/2021-mild basal reticulation in the anterior side.  Appears unchanged from prior  High-resolution CT 05/28/2021-stable pulmonary fibrosis and probable UIP pattern I have reviewed the images personally.  PFTs: 06/27/2021 FVC 3.12 [77%], FEV1 2.36 [82%], F/F 76, TLC 5.99 [84%], DLCO 21.12 [86%] Air trapping, bronchodilator response.  No obstruction on spirometry Normal diffusion capacity and lung volumes  Labs:  Assessment:  Assessment for interstitial lung disease He has history of asbestos exposure Review of his CTs shows minimal subpleural reticulation in the anterior aspect of the lower lobes.  I am not sure if this represents significant interstitial lung disease.  CT findings have remained stable since 2021 PFTs are normal  We will get baseline labs including ANA, rheumatoid factor, CCP Discussed with patient and will continue to follow this conservatively with annual CT and PFTs Follow-up in 6 months  Plan/Recommendations: Follow-up in 6 months  Marshell Garfinkel MD [facility] 06/27/2021, 12:16 PM  CC: Mayra Neer, MD

## 2021-06-27 NOTE — Patient Instructions (Addendum)
I have reviewed your lung function test which shows normal lung function.  His CT continues to be stable.  We can continue to watch this conservatively  Check ANA, CCP, RF Return to clinic in 6 months

## 2021-06-27 NOTE — Progress Notes (Signed)
PFT done today. 

## 2021-07-01 LAB — ANA,IFA RA DIAG PNL W/RFLX TIT/PATN
Anti Nuclear Antibody (ANA): NEGATIVE
Cyclic Citrullin Peptide Ab: <16 UNITS
Rheumatoid fact SerPl-aCnc: <14 IU/mL (ref <14)

## 2021-07-22 DIAGNOSIS — L812 Freckles: Secondary | ICD-10-CM | POA: Diagnosis not present

## 2021-07-22 DIAGNOSIS — D1801 Hemangioma of skin and subcutaneous tissue: Secondary | ICD-10-CM | POA: Diagnosis not present

## 2021-07-22 DIAGNOSIS — I788 Other diseases of capillaries: Secondary | ICD-10-CM | POA: Diagnosis not present

## 2021-07-22 DIAGNOSIS — L570 Actinic keratosis: Secondary | ICD-10-CM | POA: Diagnosis not present

## 2021-07-22 DIAGNOSIS — L821 Other seborrheic keratosis: Secondary | ICD-10-CM | POA: Diagnosis not present

## 2021-07-22 DIAGNOSIS — Z85828 Personal history of other malignant neoplasm of skin: Secondary | ICD-10-CM | POA: Diagnosis not present

## 2021-07-31 DIAGNOSIS — Z23 Encounter for immunization: Secondary | ICD-10-CM | POA: Diagnosis not present

## 2021-10-02 DIAGNOSIS — J849 Interstitial pulmonary disease, unspecified: Secondary | ICD-10-CM | POA: Diagnosis not present

## 2021-10-02 DIAGNOSIS — R809 Proteinuria, unspecified: Secondary | ICD-10-CM | POA: Diagnosis not present

## 2021-10-02 DIAGNOSIS — I712 Thoracic aortic aneurysm, without rupture, unspecified: Secondary | ICD-10-CM | POA: Diagnosis not present

## 2021-10-02 DIAGNOSIS — R7303 Prediabetes: Secondary | ICD-10-CM | POA: Diagnosis not present

## 2021-10-02 DIAGNOSIS — I700 Atherosclerosis of aorta: Secondary | ICD-10-CM | POA: Diagnosis not present

## 2021-10-02 DIAGNOSIS — H8110 Benign paroxysmal vertigo, unspecified ear: Secondary | ICD-10-CM | POA: Diagnosis not present

## 2021-10-02 DIAGNOSIS — E782 Mixed hyperlipidemia: Secondary | ICD-10-CM | POA: Diagnosis not present

## 2022-01-20 DIAGNOSIS — L820 Inflamed seborrheic keratosis: Secondary | ICD-10-CM | POA: Diagnosis not present

## 2022-01-20 DIAGNOSIS — L738 Other specified follicular disorders: Secondary | ICD-10-CM | POA: Diagnosis not present

## 2022-01-20 DIAGNOSIS — L570 Actinic keratosis: Secondary | ICD-10-CM | POA: Diagnosis not present

## 2022-01-20 DIAGNOSIS — D1801 Hemangioma of skin and subcutaneous tissue: Secondary | ICD-10-CM | POA: Diagnosis not present

## 2022-01-20 DIAGNOSIS — Z85828 Personal history of other malignant neoplasm of skin: Secondary | ICD-10-CM | POA: Diagnosis not present

## 2022-01-20 DIAGNOSIS — L821 Other seborrheic keratosis: Secondary | ICD-10-CM | POA: Diagnosis not present

## 2022-01-20 DIAGNOSIS — L812 Freckles: Secondary | ICD-10-CM | POA: Diagnosis not present

## 2022-03-24 ENCOUNTER — Other Ambulatory Visit: Payer: Self-pay | Admitting: Family Medicine

## 2022-03-24 DIAGNOSIS — M8580 Other specified disorders of bone density and structure, unspecified site: Secondary | ICD-10-CM | POA: Diagnosis not present

## 2022-03-24 DIAGNOSIS — J849 Interstitial pulmonary disease, unspecified: Secondary | ICD-10-CM | POA: Diagnosis not present

## 2022-03-24 DIAGNOSIS — I2721 Secondary pulmonary arterial hypertension: Secondary | ICD-10-CM | POA: Diagnosis not present

## 2022-03-24 DIAGNOSIS — R7303 Prediabetes: Secondary | ICD-10-CM | POA: Diagnosis not present

## 2022-03-24 DIAGNOSIS — Z77090 Contact with and (suspected) exposure to asbestos: Secondary | ICD-10-CM | POA: Diagnosis not present

## 2022-03-24 DIAGNOSIS — E782 Mixed hyperlipidemia: Secondary | ICD-10-CM | POA: Diagnosis not present

## 2022-03-24 DIAGNOSIS — N183 Chronic kidney disease, stage 3 unspecified: Secondary | ICD-10-CM | POA: Diagnosis not present

## 2022-03-24 DIAGNOSIS — Z0000 Encounter for general adult medical examination without abnormal findings: Secondary | ICD-10-CM | POA: Diagnosis not present

## 2022-03-24 DIAGNOSIS — I712 Thoracic aortic aneurysm, without rupture, unspecified: Secondary | ICD-10-CM

## 2022-03-24 DIAGNOSIS — K219 Gastro-esophageal reflux disease without esophagitis: Secondary | ICD-10-CM | POA: Diagnosis not present

## 2022-04-10 ENCOUNTER — Ambulatory Visit
Admission: RE | Admit: 2022-04-10 | Discharge: 2022-04-10 | Disposition: A | Discharge status: Home / Self Care | Payer: Medicare Other | Source: Ambulatory Visit | Attending: Family Medicine | Admitting: Family Medicine

## 2022-04-10 DIAGNOSIS — I712 Thoracic aortic aneurysm, without rupture, unspecified: Secondary | ICD-10-CM

## 2022-04-10 MED ORDER — IOPAMIDOL (ISOVUE-370) INJECTION 76%
75.0000 mL | Freq: Once | INTRAVENOUS | Status: AC | PRN
  Administered 2022-04-10: 75 mL via INTRAVENOUS

## 2022-04-16 DIAGNOSIS — Z23 Encounter for immunization: Secondary | ICD-10-CM | POA: Diagnosis not present

## 2022-04-30 DIAGNOSIS — H43393 Other vitreous opacities, bilateral: Secondary | ICD-10-CM | POA: Diagnosis not present

## 2022-04-30 DIAGNOSIS — R7303 Prediabetes: Secondary | ICD-10-CM | POA: Diagnosis not present

## 2022-04-30 DIAGNOSIS — Z961 Presence of intraocular lens: Secondary | ICD-10-CM | POA: Diagnosis not present

## 2022-05-26 DIAGNOSIS — D485 Neoplasm of uncertain behavior of skin: Secondary | ICD-10-CM | POA: Diagnosis not present

## 2022-05-26 DIAGNOSIS — Z85828 Personal history of other malignant neoplasm of skin: Secondary | ICD-10-CM | POA: Diagnosis not present

## 2022-05-26 DIAGNOSIS — C44229 Squamous cell carcinoma of skin of left ear and external auricular canal: Secondary | ICD-10-CM | POA: Diagnosis not present

## 2022-07-07 DIAGNOSIS — C44229 Squamous cell carcinoma of skin of left ear and external auricular canal: Secondary | ICD-10-CM | POA: Diagnosis not present

## 2022-07-07 DIAGNOSIS — Z85828 Personal history of other malignant neoplasm of skin: Secondary | ICD-10-CM | POA: Diagnosis not present

## 2022-07-28 DIAGNOSIS — D485 Neoplasm of uncertain behavior of skin: Secondary | ICD-10-CM | POA: Diagnosis not present

## 2022-07-28 DIAGNOSIS — D225 Melanocytic nevi of trunk: Secondary | ICD-10-CM | POA: Diagnosis not present

## 2022-07-28 DIAGNOSIS — L821 Other seborrheic keratosis: Secondary | ICD-10-CM | POA: Diagnosis not present

## 2022-07-28 DIAGNOSIS — Z85828 Personal history of other malignant neoplasm of skin: Secondary | ICD-10-CM | POA: Diagnosis not present

## 2022-07-28 DIAGNOSIS — Q828 Other specified congenital malformations of skin: Secondary | ICD-10-CM | POA: Diagnosis not present

## 2022-07-28 DIAGNOSIS — L738 Other specified follicular disorders: Secondary | ICD-10-CM | POA: Diagnosis not present

## 2022-07-28 DIAGNOSIS — D2371 Other benign neoplasm of skin of right lower limb, including hip: Secondary | ICD-10-CM | POA: Diagnosis not present

## 2022-07-28 DIAGNOSIS — D1801 Hemangioma of skin and subcutaneous tissue: Secondary | ICD-10-CM | POA: Diagnosis not present

## 2022-08-01 ENCOUNTER — Ambulatory Visit
Admission: EM | Admit: 2022-08-01 | Discharge: 2022-08-01 | Disposition: A | Discharge status: Home / Self Care | Payer: Medicare Other | Attending: Physician Assistant | Admitting: Physician Assistant

## 2022-08-01 DIAGNOSIS — H1031 Unspecified acute conjunctivitis, right eye: Secondary | ICD-10-CM

## 2022-08-01 MED ORDER — POLYMYXIN B-TRIMETHOPRIM 10000-0.1 UNIT/ML-% OP SOLN: 1.0000 [drp] | OPHTHALMIC | 0 refills | Status: DC

## 2022-08-01 NOTE — ED Triage Notes (Signed)
Pt presents with bilateral eye drainage and irritation X 3 days.

## 2022-08-01 NOTE — ED Provider Notes (Signed)
[facility]    CSN: [id number] Arrival date & time: 08/01/22  0950      History   Chief Complaint Chief Complaint  Patient presents with   Conjunctivitis    HPI Michael Brock is a 82 y.o. male.   Patient here today for bilateral eye drainage that started yesterday.  He reports that he had more significant issues to his right eye and has only had minimal drainage from his left started this morning.  He has had some blurriness from drainage.  He denies any pain to his eye but reports very mild irritation and itching.  He denies any headache, nausea, vomiting.  He does not report treatment for symptoms.  The history is provided by the patient.  Conjunctivitis Pertinent negatives include no headaches and no shortness of breath.    Past Medical History:  Diagnosis Date   GERD (gastroesophageal reflux disease)    Hyperlipidemia     Patient Active Problem List   Diagnosis Date Noted   Thrombosed external hemorrhoids 04/11/2013    Past Surgical History:  Procedure Laterality Date   COLON SURGERY     HERNIA REPAIR         Home Medications    Prior to Admission medications   Medication Sig Start Date End Date Taking? Authorizing Provider  trimethoprim-polymyxin b (POLYTRIM) ophthalmic solution Place 1 drop into the right eye every 4 (four) hours for 7 days. 08/01/22 08/08/22 Yes Francene Finders, PA-C  amLODipine (NORVASC) 5 MG tablet Take 5 mg by mouth daily. 04/01/21   [provider]  aspirin 81 MG tablet Take 81 mg by mouth daily.    [provider]  calcium carbonate (OS-CAL) 600 MG TABS tablet Take 600 mg by mouth 2 (two) times daily with a meal.    [provider]  cetirizine (ZYRTEC) 10 MG tablet Take 10 mg by mouth daily.    [provider]  Cholecalciferol (VITAMIN D3) 3000 UNITS TABS Take by mouth.    [provider]  CINNAMON PO Take by mouth.    [provider]  esomeprazole (NEXIUM) 40 MG  capsule Take 40 mg by mouth at bedtime.    [provider]  Garlic 2458 MG CAPS Take by mouth.    [provider]  Psyllium (METAMUCIL PO) Take by mouth.    [provider]  rosuvastatin (CRESTOR) 5 MG tablet Take 5 mg by mouth daily.    [provider]    Family History History reviewed. No pertinent family history.  Social History Social History   Tobacco Use   Smoking status: Former   Smokeless tobacco: Never  Substance Use Topics   Alcohol use: No   Drug use: No     Allergies   Patient has no known allergies.   Review of Systems Review of Systems  Constitutional:  Negative for chills and fever.  Eyes:  Positive for discharge, redness and itching. Negative for photophobia and pain.  Respiratory:  Negative for shortness of breath.   Gastrointestinal:  Negative for nausea and vomiting.  Neurological:  Negative for headaches.     Physical Exam Triage Vital Signs ED Triage Vitals [08/01/22 1048]  Enc Vitals Group     BP 117/81     Pulse Rate 94     Resp 18     Temp 98.3 F (36.8 C)     Temp Source Oral     SpO2 95 %     Weight  Height      Head Circumference      Peak Flow      Pain Score      Pain Loc      Pain Edu?      Excl. in [place]?    No data found.  Updated Vital Signs BP 117/81 (BP Location: Right Arm)   Pulse 94   Temp 98.3 F (36.8 C) (Oral)   Resp 18   SpO2 95%     Physical Exam Vitals and nursing note reviewed.  Constitutional:      General: He is not in acute distress.    Appearance: Normal appearance. He is not ill-appearing.  HENT:     Head: Normocephalic and atraumatic.  Eyes:     Extraocular Movements: Extraocular movements intact.     Pupils: Pupils are equal, round, and reactive to light.     Comments: Right conjunctiva injected, very minimal injection to left conjunctiva, mild drainage noted to right eye  Cardiovascular:     Rate and Rhythm: Normal rate.  Pulmonary:     Effort:  Pulmonary effort is normal.  Neurological:     Mental Status: He is alert.  Psychiatric:        Mood and Affect: Mood normal.        Behavior: Behavior normal.        Thought Content: Thought content normal.      UC Treatments / Results  Labs (all labs ordered are listed, but only abnormal results are displayed) Labs Reviewed - No data to display  EKG   Radiology No results found.  Procedures Procedures (including critical care time)  Medications Ordered in UC Medications - No data to display  Initial Impression / Assessment and Plan / UC Course  I have reviewed the triage vital signs and the nursing notes.  Pertinent labs & imaging results that were available during my care of the patient were reviewed by me and considered in my medical decision making (see chart for details).    Antibiotic drops prescribed to cover conjunctivitis.  Recommended further evaluation if no gradual improvement or with any further concerns.  Final Clinical Impressions(s) / UC Diagnoses   Final diagnoses:  Acute conjunctivitis of right eye, unspecified acute conjunctivitis type   Discharge Instructions   None    ED Prescriptions     Medication Sig Dispense Auth. Provider   trimethoprim-polymyxin b (POLYTRIM) ophthalmic solution Place 1 drop into the right eye every 4 (four) hours for 7 days. 10 mL Francene Finders, PA-C      PDMP not reviewed this encounter.   Francene Finders, PA-C 08/01/22 1119

## 2022-08-02 ENCOUNTER — Ambulatory Visit: Payer: PRIVATE HEALTH INSURANCE

## 2022-08-05 ENCOUNTER — Ambulatory Visit
Admission: EM | Admit: 2022-08-05 | Discharge: 2022-08-05 | Disposition: A | Discharge status: Home / Self Care | Payer: Medicare Other | Attending: Nurse Practitioner | Admitting: Nurse Practitioner

## 2022-08-05 DIAGNOSIS — J019 Acute sinusitis, unspecified: Secondary | ICD-10-CM

## 2022-08-05 DIAGNOSIS — H1033 Unspecified acute conjunctivitis, bilateral: Secondary | ICD-10-CM | POA: Diagnosis not present

## 2022-08-05 DIAGNOSIS — R0981 Nasal congestion: Secondary | ICD-10-CM | POA: Diagnosis not present

## 2022-08-05 MED ORDER — PREDNISONE 20 MG PO TABS: 20.0000 mg | ORAL_TABLET | Freq: Every day | ORAL | 0 refills | Status: AC

## 2022-08-05 MED ORDER — FLUTICASONE PROPIONATE 50 MCG/ACT NA SUSP: 2.0000 | Freq: Every day | NASAL | 0 refills | Status: AC

## 2022-08-05 MED ORDER — AMOXICILLIN 875 MG PO TABS: 875.0000 mg | ORAL_TABLET | Freq: Two times a day (BID) | ORAL | 0 refills | Status: AC

## 2022-08-05 MED ORDER — TOBRAMYCIN 0.3 % OP SOLN: 1.0000 [drp] | OPHTHALMIC | 0 refills | Status: AC

## 2022-08-05 NOTE — ED Triage Notes (Signed)
Pt presents with sinus congestion and ongoing bilateral eye drainage, crustiness, itchiness, and some slight pain X 5 days.

## 2022-08-05 NOTE — Discharge Instructions (Addendum)
Stop the Polytrim eye drops that you were prescribed on Sunday and start the new prescription eye drops  Alternate the prescription eye drops with the allergy eye drops that you have at home  Continue taking your cetrizine (zyrtec) every day  Take the amoxicillin and prednisone as prescribed. Make sure you finish these medications even if you start feeling better  Make sure you change your toothbrush after completing the antibiotics  Use the prescribed Flonase every morning as we discussed  Use a cool mist humidifier in your bedroom  Inhale steam for 10-15 minutes, 3-4 times a day; you can do this in the bathroom while a hot shower is running. Limit your exposure to cool or dry air. Avoid touching or rubbing your eyes. Using cold, wet cloths (cold compresses) to soothe itching and swelling of the eyes  Gently wipe away any crusting from your eye with a wet washcloth or a cotton ball. Wash your hands often with soap and water to reduce your exposure to germs.

## 2022-08-05 NOTE — ED Provider Notes (Signed)
[facility]    CSN: [id number] Arrival date & time: 08/05/22  1135      History   Chief Complaint Chief Complaint  Patient presents with   URI   Conjunctivitis    HPI SATVIK PARCO is a 82 y.o. male.   Subjective:   DONTRE LADUCA is a 82 y.o. male who presents for evaluation of conjunctivitis and possible sinus infection. Symptoms include bilateral eye discharge/matting, blurred vision due to the discharge, erythema, itching, and tearing. He also reports facial pain, nasal congestion, and purulent nasal discharge. No fever, chills, body aches, sore throat, post nasal drainage, cough, headache, foreign body sensation, eye pain, photophobia, or visual field deficit. Onset of symptoms was 4 days ago and has been unchanged since that time. He was seen here on 08/01/22 and diagnosed with bilateral conjunctivitis. He was prescribed Polytrim eye drops with absolutely no improvement in his eye symptoms. Patient has also been taking OTC tylenol sinus, Dayquil, Nyquil and allergy eye drops. He is drinking plenty of fluids.  He has no history of pneumonia, bronchitis, contact lens use, exposure to chemicals, or eye trauma. Patient is a non-smoker.  The following portions of the patient's history were reviewed and updated as appropriate: allergies, current medications, past family history, past medical history, past social history, past surgical history, and problem list.         Past Medical History:  Diagnosis Date   GERD (gastroesophageal reflux disease)    Hyperlipidemia     Patient Active Problem List   Diagnosis Date Noted   Thrombosed external hemorrhoids 04/11/2013    Past Surgical History:  Procedure Laterality Date   COLON SURGERY     HERNIA REPAIR         Home Medications    Prior to Admission medications   Medication Sig Start Date End Date Taking? Authorizing Provider  amoxicillin (AMOXIL) 875 MG tablet Take 1 tablet (875 mg total) by mouth 2  (two) times daily for 7 days. 08/05/22 08/12/22 Yes Enrique Sack, FNP  fluticasone (FLONASE) 50 MCG/ACT nasal spray Place 2 sprays into both nostrils daily. 08/05/22  Yes Enrique Sack, FNP  predniSONE (DELTASONE) 20 MG tablet Take 1 tablet (20 mg total) by mouth daily for 7 days. 08/05/22 08/12/22 Yes Larysa Pall, Aldona Bar, FNP  tobramycin (TOBREX) 0.3 % ophthalmic solution Place 1 drop into both eyes every 4 (four) hours for 7 days. 08/05/22 08/12/22 Yes Enrique Sack, FNP  amLODipine (NORVASC) 5 MG tablet Take 5 mg by mouth daily. 04/01/21   [provider]  aspirin 81 MG tablet Take 81 mg by mouth daily.    [provider]  calcium carbonate (OS-CAL) 600 MG TABS tablet Take 600 mg by mouth 2 (two) times daily with a meal.    [provider]  cetirizine (ZYRTEC) 10 MG tablet Take 10 mg by mouth daily.    [provider]  Cholecalciferol (VITAMIN D3) 3000 UNITS TABS Take by mouth.    [provider]  CINNAMON PO Take by mouth.    [provider]  esomeprazole (NEXIUM) 40 MG capsule Take 40 mg by mouth at bedtime.    [provider]  Garlic 0814 MG CAPS Take by mouth.    [provider]  Psyllium (METAMUCIL PO) Take by mouth.    [provider]  rosuvastatin (CRESTOR) 5 MG tablet Take 5 mg by mouth daily.    [provider]    Family History History reviewed. No  pertinent family history.  Social History Social History   Tobacco Use   Smoking status: Former   Smokeless tobacco: Never  Substance Use Topics   Alcohol use: No   Drug use: No     Allergies   Patient has no known allergies.   Review of Systems Review of Systems  Constitutional:  Negative for activity change, appetite change, chills, diaphoresis, fatigue and fever.  HENT:  Positive for congestion and rhinorrhea. Negative for ear pain, postnasal drip, sinus pressure, sneezing and sore throat.   Eyes:  Positive for discharge,  redness and itching. Negative for photophobia, pain and visual disturbance.  Respiratory:  Negative for cough and shortness of breath.   Gastrointestinal:  Negative for nausea and vomiting.  Neurological:  Negative for dizziness and headaches.  All other systems reviewed and are negative.    Physical Exam Triage Vital Signs ED Triage Vitals  Enc Vitals Group     BP --      Pulse Rate 08/05/22 1330 79     Resp 08/05/22 1330 17     Temp 08/05/22 1330 98.1 F (36.7 C)     Temp Source 08/05/22 1330 Oral     SpO2 08/05/22 1330 94 %     Weight --      Height --      Head Circumference --      Peak Flow --      Pain Score 08/05/22 1329 3     Pain Loc --      Pain Edu? --      Excl. in [place]? --    No data found.  Updated Vital Signs Pulse 79   Temp 98.1 F (36.7 C) (Oral)   Resp 17   SpO2 94%   Visual Acuity Right Eye Distance:   Left Eye Distance:   Bilateral Distance:    Right Eye Near:   Left Eye Near:    Bilateral Near:     Physical Exam Vitals reviewed.  Constitutional:      General: He is not in acute distress.    Appearance: Normal appearance. He is not ill-appearing, toxic-appearing or diaphoretic.  HENT:     Head: Normocephalic.     Right Ear: Tympanic membrane, ear canal and external ear normal.     Left Ear: Tympanic membrane, ear canal and external ear normal.     Nose: Mucosal edema and congestion present.     Right Turbinates: Not swollen.     Left Turbinates: Not swollen.     Mouth/Throat:     Mouth: Mucous membranes are moist.  Eyes:     General: Lids are normal. Vision grossly intact. Gaze aligned appropriately. No visual field deficit.       Right eye: Discharge present.        Left eye: Discharge present.    Extraocular Movements: Extraocular movements intact.     Conjunctiva/sclera:     Right eye: Right conjunctiva is injected. No exudate.    Left eye: Left conjunctiva is injected. No exudate.    Pupils: Pupils are equal, round, and  reactive to light.  Cardiovascular:     Rate and Rhythm: Normal rate and regular rhythm.  Pulmonary:     Effort: Pulmonary effort is normal.     Breath sounds: Normal breath sounds.  Musculoskeletal:        General: Normal range of motion.     Cervical back: Normal range of motion and neck supple.  Lymphadenopathy:  Cervical: No cervical adenopathy.  Skin:    General: Skin is warm and dry.  Neurological:     General: No focal deficit present.     Mental Status: He is alert and oriented to person, place, and time.      UC Treatments / Results  Labs (all labs ordered are listed, but only abnormal results are displayed) Labs Reviewed - No data to display  EKG   Radiology No results found.  Procedures Procedures (including critical care time)  Medications Ordered in UC Medications - No data to display  Initial Impression / Assessment and Plan / UC Course  I have reviewed the triage vital signs and the nursing notes.  Pertinent labs & imaging results that were available during my care of the patient were reviewed by me and considered in my medical decision making (see chart for details).    82 yo male presenting with bilateral conjunctivitis and sinus infection. Symptoms are likely both due to a viral process but trial of antibiotics will be given per patient and wife's request. Supportive care measures also discussed and encouraged.  Patient should follow-up with PCP if symptoms worsen or persist.   Today's evaluation has revealed no signs of a dangerous process. Discussed diagnosis with patient and/or guardian. Patient and/or guardian aware of their diagnosis, possible red flag symptoms to watch out for and need for close follow up. Patient and/or guardian understands verbal and written discharge instructions. Patient and/or guardian comfortable with plan and disposition.  Patient and/or guardian has a clear mental status at this time, good insight into illness (after  discussion and teaching) and has clear judgment to make decisions regarding their care  Documentation was completed with the aid of voice recognition software. Transcription may contain typographical errors. Final Clinical Impressions(s) / UC Diagnoses   Final diagnoses:  Acute conjunctivitis of both eyes, unspecified acute conjunctivitis type  Nasal congestion  Acute non-recurrent sinusitis, unspecified location     Discharge Instructions      Stop the Polytrim eye drops that you were prescribed on Sunday and start the new prescription eye drops  Alternate the prescription eye drops with the allergy eye drops that you have at home  Continue taking your cetrizine (zyrtec) every day  Take the amoxicillin and prednisone as prescribed. Make sure you finish these medications even if you start feeling better  Make sure you change your toothbrush after completing the antibiotics  Use the prescribed Flonase every morning as we discussed  Use a cool mist humidifier in your bedroom  Inhale steam for 10-15 minutes, 3-4 times a day; you can do this in the bathroom while a hot shower is running. Limit your exposure to cool or dry air. Avoid touching or rubbing your eyes. Using cold, wet cloths (cold compresses) to soothe itching and swelling of the eyes  Gently wipe away any crusting from your eye with a wet washcloth or a cotton ball. Wash your hands often with soap and water to reduce your exposure to germs.          ED Prescriptions     Medication Sig Dispense Auth. Provider   tobramycin (TOBREX) 0.3 % ophthalmic solution Place 1 drop into both eyes every 4 (four) hours for 7 days. 2.1 mL Enrique Sack, FNP   amoxicillin (AMOXIL) 875 MG tablet Take 1 tablet (875 mg total) by mouth 2 (two) times daily for 7 days. 14 tablet Joeph Szatkowski, [place], FNP   predniSONE (DELTASONE) 20 MG tablet Take 1  tablet (20 mg total) by mouth daily for 7 days. 7 tablet Armentha Branagan, Aldona Bar, FNP    fluticasone (FLONASE) 50 MCG/ACT nasal spray Place 2 sprays into both nostrils daily. 16 g Enrique Sack, FNP      PDMP not reviewed this encounter.   Enrique Sack, [place] 08/05/22 1428

## 2022-09-24 DIAGNOSIS — I2721 Secondary pulmonary arterial hypertension: Secondary | ICD-10-CM | POA: Diagnosis not present

## 2022-09-24 DIAGNOSIS — N529 Male erectile dysfunction, unspecified: Secondary | ICD-10-CM | POA: Diagnosis not present

## 2022-09-24 DIAGNOSIS — R7303 Prediabetes: Secondary | ICD-10-CM | POA: Diagnosis not present

## 2022-09-24 DIAGNOSIS — E782 Mixed hyperlipidemia: Secondary | ICD-10-CM | POA: Diagnosis not present

## 2022-09-24 DIAGNOSIS — I10 Essential (primary) hypertension: Secondary | ICD-10-CM | POA: Diagnosis not present

## 2022-09-24 DIAGNOSIS — R809 Proteinuria, unspecified: Secondary | ICD-10-CM | POA: Diagnosis not present

## 2022-09-24 DIAGNOSIS — J849 Interstitial pulmonary disease, unspecified: Secondary | ICD-10-CM | POA: Diagnosis not present

## 2022-09-24 DIAGNOSIS — N183 Chronic kidney disease, stage 3 unspecified: Secondary | ICD-10-CM | POA: Diagnosis not present

## 2023-01-26 DIAGNOSIS — L918 Other hypertrophic disorders of the skin: Secondary | ICD-10-CM | POA: Diagnosis not present

## 2023-01-26 DIAGNOSIS — L821 Other seborrheic keratosis: Secondary | ICD-10-CM | POA: Diagnosis not present

## 2023-01-26 DIAGNOSIS — D1801 Hemangioma of skin and subcutaneous tissue: Secondary | ICD-10-CM | POA: Diagnosis not present

## 2023-01-26 DIAGNOSIS — L570 Actinic keratosis: Secondary | ICD-10-CM | POA: Diagnosis not present

## 2023-01-26 DIAGNOSIS — Z85828 Personal history of other malignant neoplasm of skin: Secondary | ICD-10-CM | POA: Diagnosis not present

## 2023-01-26 DIAGNOSIS — L812 Freckles: Secondary | ICD-10-CM | POA: Diagnosis not present

## 2023-01-26 DIAGNOSIS — D225 Melanocytic nevi of trunk: Secondary | ICD-10-CM | POA: Diagnosis not present

## 2023-04-01 DIAGNOSIS — J849 Interstitial pulmonary disease, unspecified: Secondary | ICD-10-CM | POA: Diagnosis not present

## 2023-04-01 DIAGNOSIS — E1122 Type 2 diabetes mellitus with diabetic chronic kidney disease: Secondary | ICD-10-CM | POA: Diagnosis not present

## 2023-04-01 DIAGNOSIS — I2721 Secondary pulmonary arterial hypertension: Secondary | ICD-10-CM | POA: Diagnosis not present

## 2023-04-01 DIAGNOSIS — Z77090 Contact with and (suspected) exposure to asbestos: Secondary | ICD-10-CM | POA: Diagnosis not present

## 2023-04-01 DIAGNOSIS — I712 Thoracic aortic aneurysm, without rupture, unspecified: Secondary | ICD-10-CM | POA: Diagnosis not present

## 2023-04-01 DIAGNOSIS — E782 Mixed hyperlipidemia: Secondary | ICD-10-CM | POA: Diagnosis not present

## 2023-04-01 DIAGNOSIS — Z0000 Encounter for general adult medical examination without abnormal findings: Secondary | ICD-10-CM | POA: Diagnosis not present

## 2023-04-01 DIAGNOSIS — N183 Chronic kidney disease, stage 3 unspecified: Secondary | ICD-10-CM | POA: Diagnosis not present

## 2023-04-01 DIAGNOSIS — K219 Gastro-esophageal reflux disease without esophagitis: Secondary | ICD-10-CM | POA: Diagnosis not present

## 2023-04-01 DIAGNOSIS — M8580 Other specified disorders of bone density and structure, unspecified site: Secondary | ICD-10-CM | POA: Diagnosis not present

## 2023-04-02 ENCOUNTER — Other Ambulatory Visit: Payer: Self-pay | Admitting: Family Medicine

## 2023-04-02 DIAGNOSIS — M8589 Other specified disorders of bone density and structure, multiple sites: Secondary | ICD-10-CM

## 2023-04-02 DIAGNOSIS — I712 Thoracic aortic aneurysm, without rupture, unspecified: Secondary | ICD-10-CM

## 2023-04-19 ENCOUNTER — Ambulatory Visit
Admission: RE | Admit: 2023-04-19 | Discharge: 2023-04-19 | Disposition: A | Discharge status: Home / Self Care | Payer: Medicare Other | Source: Ambulatory Visit | Attending: Family Medicine | Admitting: Family Medicine

## 2023-04-19 DIAGNOSIS — I7123 Aneurysm of the descending thoracic aorta, without rupture: Secondary | ICD-10-CM | POA: Diagnosis not present

## 2023-04-19 DIAGNOSIS — Z23 Encounter for immunization: Secondary | ICD-10-CM | POA: Diagnosis not present

## 2023-04-19 DIAGNOSIS — K449 Diaphragmatic hernia without obstruction or gangrene: Secondary | ICD-10-CM | POA: Diagnosis not present

## 2023-04-19 DIAGNOSIS — I712 Thoracic aortic aneurysm, without rupture, unspecified: Secondary | ICD-10-CM

## 2023-04-19 DIAGNOSIS — K8020 Calculus of gallbladder without cholecystitis without obstruction: Secondary | ICD-10-CM | POA: Diagnosis not present

## 2023-04-19 MED ORDER — IOPAMIDOL (ISOVUE-370) INJECTION 76%
75.0000 mL | Freq: Once | INTRAVENOUS | Status: AC | PRN
  Administered 2023-04-19: 75 mL via INTRAVENOUS

## 2023-05-06 DIAGNOSIS — H43393 Other vitreous opacities, bilateral: Secondary | ICD-10-CM | POA: Diagnosis not present

## 2023-05-06 DIAGNOSIS — Z961 Presence of intraocular lens: Secondary | ICD-10-CM | POA: Diagnosis not present

## 2023-05-06 DIAGNOSIS — H02831 Dermatochalasis of right upper eyelid: Secondary | ICD-10-CM | POA: Diagnosis not present

## 2023-05-06 DIAGNOSIS — H02834 Dermatochalasis of left upper eyelid: Secondary | ICD-10-CM | POA: Diagnosis not present

## 2023-07-22 DIAGNOSIS — D485 Neoplasm of uncertain behavior of skin: Secondary | ICD-10-CM | POA: Diagnosis not present

## 2023-07-22 DIAGNOSIS — L570 Actinic keratosis: Secondary | ICD-10-CM | POA: Diagnosis not present

## 2023-07-22 DIAGNOSIS — D1801 Hemangioma of skin and subcutaneous tissue: Secondary | ICD-10-CM | POA: Diagnosis not present

## 2023-07-22 DIAGNOSIS — L821 Other seborrheic keratosis: Secondary | ICD-10-CM | POA: Diagnosis not present

## 2023-07-22 DIAGNOSIS — D225 Melanocytic nevi of trunk: Secondary | ICD-10-CM | POA: Diagnosis not present

## 2023-07-22 DIAGNOSIS — C44612 Basal cell carcinoma of skin of right upper limb, including shoulder: Secondary | ICD-10-CM | POA: Diagnosis not present

## 2023-07-22 DIAGNOSIS — L820 Inflamed seborrheic keratosis: Secondary | ICD-10-CM | POA: Diagnosis not present

## 2023-07-22 DIAGNOSIS — L812 Freckles: Secondary | ICD-10-CM | POA: Diagnosis not present

## 2023-07-22 DIAGNOSIS — L858 Other specified epidermal thickening: Secondary | ICD-10-CM | POA: Diagnosis not present

## 2023-07-22 DIAGNOSIS — Z85828 Personal history of other malignant neoplasm of skin: Secondary | ICD-10-CM | POA: Diagnosis not present

## 2023-09-21 DIAGNOSIS — E1122 Type 2 diabetes mellitus with diabetic chronic kidney disease: Secondary | ICD-10-CM | POA: Diagnosis not present

## 2023-09-21 DIAGNOSIS — N183 Chronic kidney disease, stage 3 unspecified: Secondary | ICD-10-CM | POA: Diagnosis not present

## 2023-09-21 DIAGNOSIS — I10 Essential (primary) hypertension: Secondary | ICD-10-CM | POA: Diagnosis not present

## 2023-09-28 DIAGNOSIS — R809 Proteinuria, unspecified: Secondary | ICD-10-CM | POA: Diagnosis not present

## 2023-09-28 DIAGNOSIS — I10 Essential (primary) hypertension: Secondary | ICD-10-CM | POA: Diagnosis not present

## 2023-09-28 DIAGNOSIS — E1122 Type 2 diabetes mellitus with diabetic chronic kidney disease: Secondary | ICD-10-CM | POA: Diagnosis not present

## 2023-09-28 DIAGNOSIS — E782 Mixed hyperlipidemia: Secondary | ICD-10-CM | POA: Diagnosis not present

## 2023-09-28 DIAGNOSIS — N183 Chronic kidney disease, stage 3 unspecified: Secondary | ICD-10-CM | POA: Diagnosis not present

## 2023-10-11 DIAGNOSIS — E782 Mixed hyperlipidemia: Secondary | ICD-10-CM | POA: Diagnosis not present

## 2023-10-11 DIAGNOSIS — I10 Essential (primary) hypertension: Secondary | ICD-10-CM | POA: Diagnosis not present

## 2023-10-11 DIAGNOSIS — E1122 Type 2 diabetes mellitus with diabetic chronic kidney disease: Secondary | ICD-10-CM | POA: Diagnosis not present

## 2023-10-11 DIAGNOSIS — N183 Chronic kidney disease, stage 3 unspecified: Secondary | ICD-10-CM | POA: Diagnosis not present

## 2023-10-20 DIAGNOSIS — N183 Chronic kidney disease, stage 3 unspecified: Secondary | ICD-10-CM | POA: Diagnosis not present

## 2023-10-20 DIAGNOSIS — E1122 Type 2 diabetes mellitus with diabetic chronic kidney disease: Secondary | ICD-10-CM | POA: Diagnosis not present

## 2023-10-20 DIAGNOSIS — I10 Essential (primary) hypertension: Secondary | ICD-10-CM | POA: Diagnosis not present

## 2023-11-10 DIAGNOSIS — E782 Mixed hyperlipidemia: Secondary | ICD-10-CM | POA: Diagnosis not present

## 2023-11-10 DIAGNOSIS — E1122 Type 2 diabetes mellitus with diabetic chronic kidney disease: Secondary | ICD-10-CM | POA: Diagnosis not present

## 2023-11-10 DIAGNOSIS — N183 Chronic kidney disease, stage 3 unspecified: Secondary | ICD-10-CM | POA: Diagnosis not present

## 2023-11-10 DIAGNOSIS — I10 Essential (primary) hypertension: Secondary | ICD-10-CM | POA: Diagnosis not present

## 2023-11-19 DIAGNOSIS — N183 Chronic kidney disease, stage 3 unspecified: Secondary | ICD-10-CM | POA: Diagnosis not present

## 2023-11-19 DIAGNOSIS — I10 Essential (primary) hypertension: Secondary | ICD-10-CM | POA: Diagnosis not present

## 2023-11-19 DIAGNOSIS — E1122 Type 2 diabetes mellitus with diabetic chronic kidney disease: Secondary | ICD-10-CM | POA: Diagnosis not present

## 2023-12-11 DIAGNOSIS — N183 Chronic kidney disease, stage 3 unspecified: Secondary | ICD-10-CM | POA: Diagnosis not present

## 2023-12-11 DIAGNOSIS — I10 Essential (primary) hypertension: Secondary | ICD-10-CM | POA: Diagnosis not present

## 2023-12-11 DIAGNOSIS — E1122 Type 2 diabetes mellitus with diabetic chronic kidney disease: Secondary | ICD-10-CM | POA: Diagnosis not present

## 2023-12-11 DIAGNOSIS — E782 Mixed hyperlipidemia: Secondary | ICD-10-CM | POA: Diagnosis not present

## 2023-12-19 DIAGNOSIS — N183 Chronic kidney disease, stage 3 unspecified: Secondary | ICD-10-CM | POA: Diagnosis not present

## 2023-12-19 DIAGNOSIS — E1122 Type 2 diabetes mellitus with diabetic chronic kidney disease: Secondary | ICD-10-CM | POA: Diagnosis not present

## 2023-12-19 DIAGNOSIS — I10 Essential (primary) hypertension: Secondary | ICD-10-CM | POA: Diagnosis not present

## 2024-01-18 DIAGNOSIS — N183 Chronic kidney disease, stage 3 unspecified: Secondary | ICD-10-CM | POA: Diagnosis not present

## 2024-01-18 DIAGNOSIS — E1122 Type 2 diabetes mellitus with diabetic chronic kidney disease: Secondary | ICD-10-CM | POA: Diagnosis not present

## 2024-01-18 DIAGNOSIS — I10 Essential (primary) hypertension: Secondary | ICD-10-CM | POA: Diagnosis not present

## 2024-01-19 DIAGNOSIS — D225 Melanocytic nevi of trunk: Secondary | ICD-10-CM | POA: Diagnosis not present

## 2024-01-19 DIAGNOSIS — D692 Other nonthrombocytopenic purpura: Secondary | ICD-10-CM | POA: Diagnosis not present

## 2024-01-19 DIAGNOSIS — L821 Other seborrheic keratosis: Secondary | ICD-10-CM | POA: Diagnosis not present

## 2024-01-19 DIAGNOSIS — L820 Inflamed seborrheic keratosis: Secondary | ICD-10-CM | POA: Diagnosis not present

## 2024-01-19 DIAGNOSIS — Z85828 Personal history of other malignant neoplasm of skin: Secondary | ICD-10-CM | POA: Diagnosis not present

## 2024-01-19 DIAGNOSIS — D1801 Hemangioma of skin and subcutaneous tissue: Secondary | ICD-10-CM | POA: Diagnosis not present

## 2024-01-19 DIAGNOSIS — L570 Actinic keratosis: Secondary | ICD-10-CM | POA: Diagnosis not present

## 2024-02-10 DIAGNOSIS — E1122 Type 2 diabetes mellitus with diabetic chronic kidney disease: Secondary | ICD-10-CM | POA: Diagnosis not present

## 2024-02-10 DIAGNOSIS — E782 Mixed hyperlipidemia: Secondary | ICD-10-CM | POA: Diagnosis not present

## 2024-02-10 DIAGNOSIS — I10 Essential (primary) hypertension: Secondary | ICD-10-CM | POA: Diagnosis not present

## 2024-02-10 DIAGNOSIS — N183 Chronic kidney disease, stage 3 unspecified: Secondary | ICD-10-CM | POA: Diagnosis not present

## 2024-02-17 DIAGNOSIS — N183 Chronic kidney disease, stage 3 unspecified: Secondary | ICD-10-CM | POA: Diagnosis not present

## 2024-02-17 DIAGNOSIS — I10 Essential (primary) hypertension: Secondary | ICD-10-CM | POA: Diagnosis not present

## 2024-02-17 DIAGNOSIS — E1122 Type 2 diabetes mellitus with diabetic chronic kidney disease: Secondary | ICD-10-CM | POA: Diagnosis not present

## 2024-03-18 DIAGNOSIS — E1122 Type 2 diabetes mellitus with diabetic chronic kidney disease: Secondary | ICD-10-CM | POA: Diagnosis not present

## 2024-03-18 DIAGNOSIS — I10 Essential (primary) hypertension: Secondary | ICD-10-CM | POA: Diagnosis not present

## 2024-03-18 DIAGNOSIS — N183 Chronic kidney disease, stage 3 unspecified: Secondary | ICD-10-CM | POA: Diagnosis not present

## 2024-04-06 DIAGNOSIS — Z1331 Encounter for screening for depression: Secondary | ICD-10-CM | POA: Diagnosis not present

## 2024-04-06 DIAGNOSIS — J849 Interstitial pulmonary disease, unspecified: Secondary | ICD-10-CM | POA: Diagnosis not present

## 2024-04-06 DIAGNOSIS — E782 Mixed hyperlipidemia: Secondary | ICD-10-CM | POA: Diagnosis not present

## 2024-04-06 DIAGNOSIS — K219 Gastro-esophageal reflux disease without esophagitis: Secondary | ICD-10-CM | POA: Diagnosis not present

## 2024-04-06 DIAGNOSIS — I712 Thoracic aortic aneurysm, without rupture, unspecified: Secondary | ICD-10-CM | POA: Diagnosis not present

## 2024-04-06 DIAGNOSIS — M8580 Other specified disorders of bone density and structure, unspecified site: Secondary | ICD-10-CM | POA: Diagnosis not present

## 2024-04-06 DIAGNOSIS — N183 Chronic kidney disease, stage 3 unspecified: Secondary | ICD-10-CM | POA: Diagnosis not present

## 2024-04-06 DIAGNOSIS — Z77090 Contact with and (suspected) exposure to asbestos: Secondary | ICD-10-CM | POA: Diagnosis not present

## 2024-04-06 DIAGNOSIS — Z0000 Encounter for general adult medical examination without abnormal findings: Secondary | ICD-10-CM | POA: Diagnosis not present

## 2024-04-06 DIAGNOSIS — M549 Dorsalgia, unspecified: Secondary | ICD-10-CM | POA: Diagnosis not present

## 2024-04-06 DIAGNOSIS — E1122 Type 2 diabetes mellitus with diabetic chronic kidney disease: Secondary | ICD-10-CM | POA: Diagnosis not present

## 2024-04-06 DIAGNOSIS — I2721 Secondary pulmonary arterial hypertension: Secondary | ICD-10-CM | POA: Diagnosis not present

## 2024-04-07 ENCOUNTER — Other Ambulatory Visit: Payer: Self-pay | Admitting: Family Medicine

## 2024-04-07 DIAGNOSIS — I712 Thoracic aortic aneurysm, without rupture, unspecified: Secondary | ICD-10-CM

## 2024-04-10 DIAGNOSIS — Z23 Encounter for immunization: Secondary | ICD-10-CM | POA: Diagnosis not present

## 2024-04-20 ENCOUNTER — Ambulatory Visit
Admission: RE | Admit: 2024-04-20 | Discharge: 2024-04-20 | Disposition: A | Discharge status: Home / Self Care | Payer: PRIVATE HEALTH INSURANCE | Source: Ambulatory Visit | Attending: Family Medicine | Admitting: Family Medicine

## 2024-04-20 DIAGNOSIS — I712 Thoracic aortic aneurysm, without rupture, unspecified: Secondary | ICD-10-CM | POA: Diagnosis not present

## 2024-04-20 MED ORDER — IOPAMIDOL (ISOVUE-370) INJECTION 76%
75.0000 mL | Freq: Once | INTRAVENOUS | Status: AC | PRN
  Administered 2024-04-20: 75 mL via INTRAVENOUS

## 2024-05-08 DIAGNOSIS — H02834 Dermatochalasis of left upper eyelid: Secondary | ICD-10-CM | POA: Diagnosis not present

## 2024-05-08 DIAGNOSIS — H43393 Other vitreous opacities, bilateral: Secondary | ICD-10-CM | POA: Diagnosis not present

## 2024-05-08 DIAGNOSIS — R7303 Prediabetes: Secondary | ICD-10-CM | POA: Diagnosis not present

## 2024-05-08 DIAGNOSIS — H35371 Puckering of macula, right eye: Secondary | ICD-10-CM | POA: Diagnosis not present

## 2024-05-08 DIAGNOSIS — Z961 Presence of intraocular lens: Secondary | ICD-10-CM | POA: Diagnosis not present
